# Patient Record
Sex: Female | Born: 1975 | Race: White | Hispanic: No | Marital: Single | Smoking: Never smoker
Health system: Southern US, Community
[De-identification: ages and names within clinical notes are randomized; demographics above are authoritative.]

## PROBLEM LIST (undated history)

## (undated) DIAGNOSIS — F329 Major depressive disorder, single episode, unspecified: Secondary | ICD-10-CM

## (undated) DIAGNOSIS — F32A Depression, unspecified: Secondary | ICD-10-CM

## (undated) DIAGNOSIS — I10 Essential (primary) hypertension: Secondary | ICD-10-CM

## (undated) DIAGNOSIS — E559 Vitamin D deficiency, unspecified: Secondary | ICD-10-CM

## (undated) DIAGNOSIS — E785 Hyperlipidemia, unspecified: Secondary | ICD-10-CM

## (undated) HISTORY — DX: Essential (primary) hypertension: I10

## (undated) HISTORY — DX: Vitamin D deficiency, unspecified: E55.9

## (undated) HISTORY — DX: Depression, unspecified: F32.A

## (undated) HISTORY — PX: REDUCTION MAMMAPLASTY: SUR839

## (undated) HISTORY — DX: Hyperlipidemia, unspecified: E78.5

---
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
## 2006-11-02 HISTORY — PX: BACK SURGERY: SHX140

## 2007-01-06 ENCOUNTER — Ambulatory Visit (HOSPITAL_COMMUNITY): Admission: RE | Admit: 2007-01-06 | Discharge: 2007-01-06 | Payer: Self-pay | Admitting: Neurological Surgery

## 2007-07-25 IMAGING — CR DG LUMBAR SPINE 2-3V
1 series · 1 of 1 positions shown · non-contrast
Comparison: none

CLINICAL DATA: 30-year-old, disc herniation. 
 LUMBAR SPINE ? 2 VIEW:

[view not recorded]
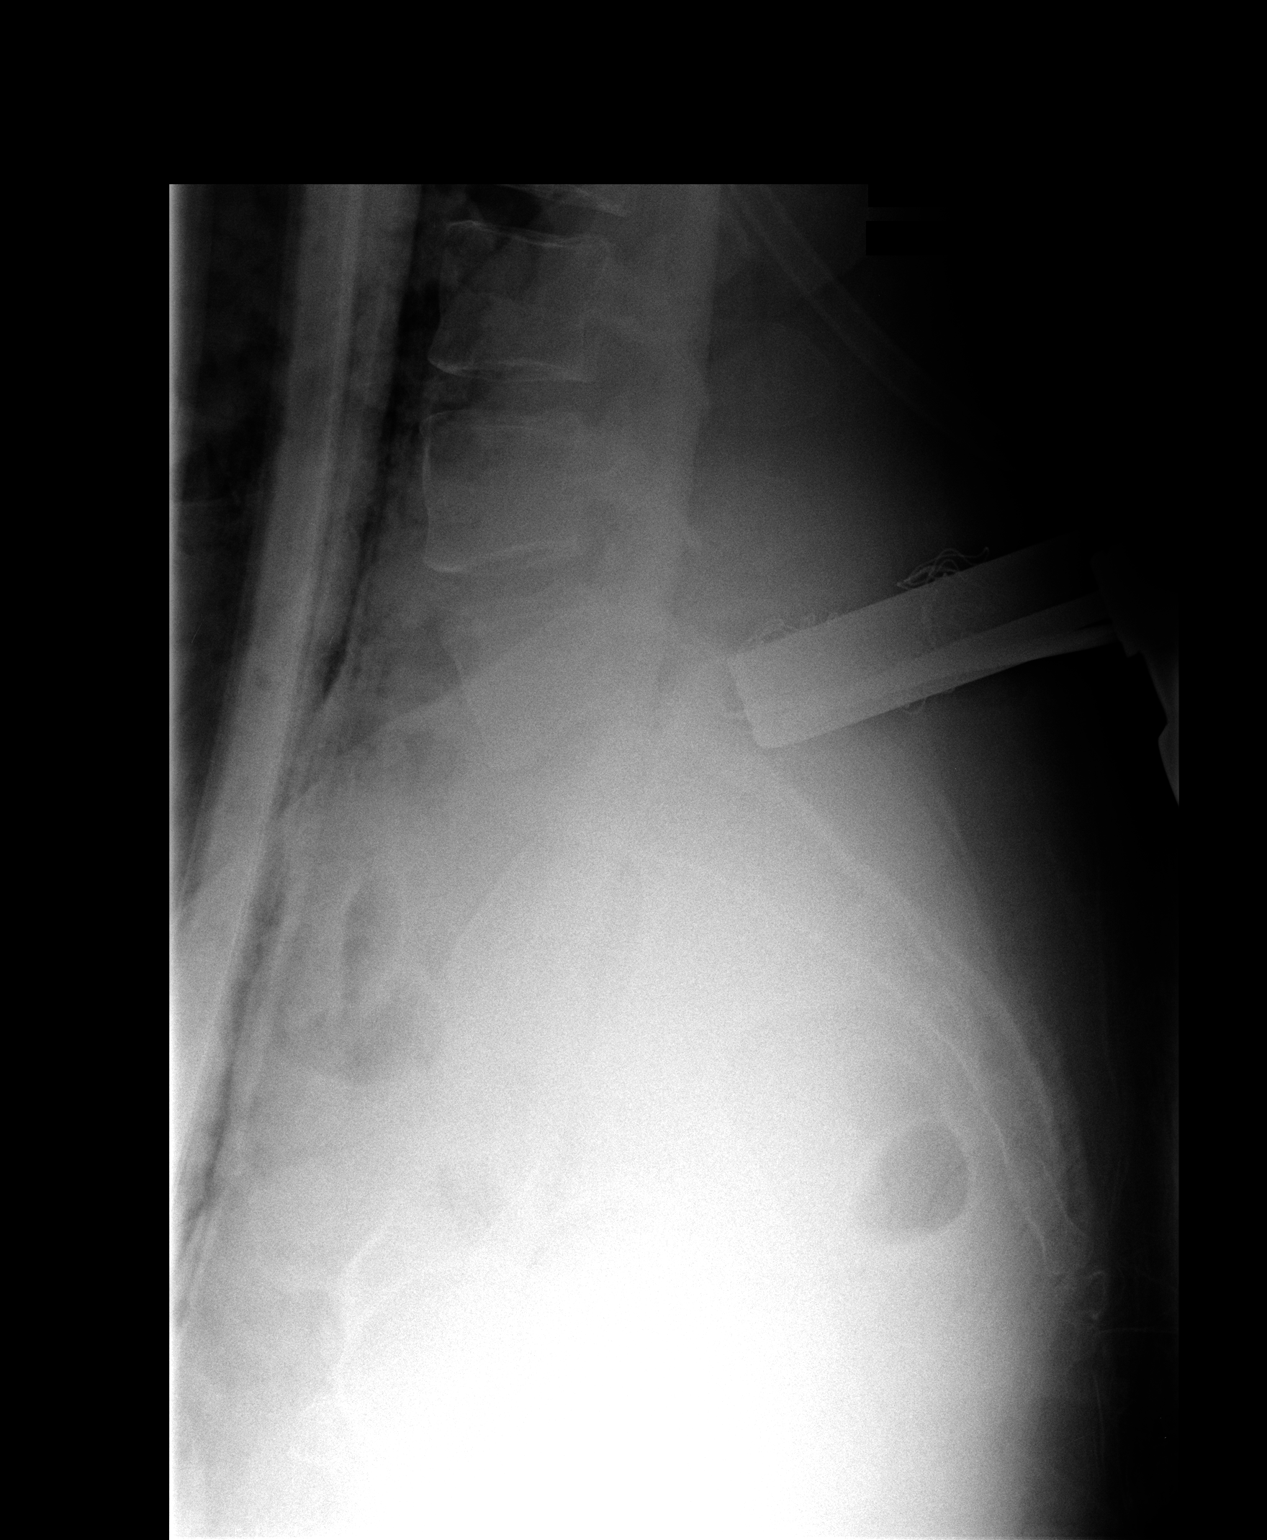

[1 of 1 positions shown; findings below may reference images not displayed]

FINDINGS: Two lateral lumbar spine films from the operating room.  The first film demonstrate a spinal needle projecting at the L5-S1 disc space.  The second film demonstrates surgical retractors and instrument marking the L5-S1 disc space.
IMPRESSION: L5-S1 marked intraoperatively.

## 2019-07-26 ENCOUNTER — Other Ambulatory Visit: Payer: Self-pay | Admitting: Sports Medicine

## 2019-07-26 DIAGNOSIS — M25572 Pain in left ankle and joints of left foot: Secondary | ICD-10-CM

## 2019-07-27 ENCOUNTER — Encounter: Payer: Self-pay | Admitting: Sports Medicine

## 2019-07-27 ENCOUNTER — Ambulatory Visit (INDEPENDENT_AMBULATORY_CARE_PROVIDER_SITE_OTHER): Payer: PRIVATE HEALTH INSURANCE

## 2019-07-27 ENCOUNTER — Ambulatory Visit: Payer: PRIVATE HEALTH INSURANCE | Admitting: Sports Medicine

## 2019-07-27 ENCOUNTER — Other Ambulatory Visit: Payer: Self-pay

## 2019-07-27 ENCOUNTER — Other Ambulatory Visit: Payer: Self-pay | Admitting: Sports Medicine

## 2019-07-27 DIAGNOSIS — M779 Enthesopathy, unspecified: Secondary | ICD-10-CM | POA: Diagnosis not present

## 2019-07-27 DIAGNOSIS — M25372 Other instability, left ankle: Secondary | ICD-10-CM | POA: Diagnosis not present

## 2019-07-27 DIAGNOSIS — S93402A Sprain of unspecified ligament of left ankle, initial encounter: Secondary | ICD-10-CM | POA: Diagnosis not present

## 2019-07-27 DIAGNOSIS — M79672 Pain in left foot: Secondary | ICD-10-CM | POA: Diagnosis not present

## 2019-07-27 DIAGNOSIS — M25572 Pain in left ankle and joints of left foot: Secondary | ICD-10-CM | POA: Diagnosis not present

## 2019-07-27 MED ORDER — PREDNISONE 10 MG (21) PO TBPK
ORAL_TABLET | ORAL | 0 refills | Status: DC
Start: 2019-07-27 — End: 2020-01-19

## 2019-07-27 NOTE — Progress Notes (Signed)
Subjective:  Allison Baldwin is a 43 y.o. female patient who presents to office for evaluation of left ankle pain. Patient complains of continued pain in the ankle since last week that gets worse by end of day and is limping with sharp pain especially with steps with foot pointing down with popping. Patient has tried nothing with no relief in symptoms. Patient denies any other pedal complaints. Denies injury/trip/fall/sprain/any causative factors.   Review of Systems  All other systems reviewed and are negative.   There are no active problems to display for this patient.   No current outpatient medications on file prior to visit.   No current facility-administered medications on file prior to visit.     Not on File  Objective:  General: Alert and oriented x3 in no acute distress  Dermatology: No open lesions bilateral lower extremities, no webspace macerations, no ecchymosis bilateral, all nails x 10 are well manicured.  Vascular: Dorsalis Pedis and Posterior Tibial pedal pulses palpable, Capillary Fill Time 3 seconds,(+) pedal hair growth bilateral, no edema bilateral lower extremities, Temperature gradient within normal limits.  Neurology: Johney Maine sensation intact via light touch bilateral.  Musculoskeletal: Mild tenderness with palpation at lateral ankle ligaments and  peroneal tendon course on left ankle. Negative talar tilt, Negative tib-fib stress, Mild anterior drawer with instability on left. No pain with calf compression bilateral. Range of motion within normal limits with mild guarding on left ankle. Strength within normal limits in all groups bilateral.   Gait: Antalgic gait  Xrays  Left Ankle   Impression: No acute osseous findings  Assessment and Plan: Problem List Items Addressed This Visit    None    Visit Diagnoses    Acute left ankle pain    -  Primary   Tendonitis       Sprain of left ankle, unspecified ligament, initial encounter       Ankle instability,  left          -Complete examination performed -Xrays reviewed -Discussed treatement options -Rx Prednisone dose pak -Rx Trilock -Recommend rest ice elevation and topical pain creams as needed -Patient to return to office in 2 weeks or sooner if condition worsens.  Advised patient if she fails to improve may benefit from an MRI for further evaluation of ankle tendons and ligaments.  Landis Martins, DPM

## 2019-08-02 ENCOUNTER — Other Ambulatory Visit: Payer: Self-pay | Admitting: Sports Medicine

## 2019-08-02 DIAGNOSIS — M25572 Pain in left ankle and joints of left foot: Secondary | ICD-10-CM

## 2019-08-02 DIAGNOSIS — S93402A Sprain of unspecified ligament of left ankle, initial encounter: Secondary | ICD-10-CM

## 2019-08-02 DIAGNOSIS — M79672 Pain in left foot: Secondary | ICD-10-CM

## 2019-08-02 DIAGNOSIS — M779 Enthesopathy, unspecified: Secondary | ICD-10-CM

## 2019-08-16 ENCOUNTER — Other Ambulatory Visit: Payer: Self-pay

## 2019-08-16 ENCOUNTER — Ambulatory Visit: Payer: PRIVATE HEALTH INSURANCE | Admitting: Sports Medicine

## 2019-08-16 ENCOUNTER — Encounter: Payer: Self-pay | Admitting: Sports Medicine

## 2019-08-16 DIAGNOSIS — S93402D Sprain of unspecified ligament of left ankle, subsequent encounter: Secondary | ICD-10-CM

## 2019-08-16 DIAGNOSIS — M25372 Other instability, left ankle: Secondary | ICD-10-CM | POA: Diagnosis not present

## 2019-08-16 DIAGNOSIS — M25572 Pain in left ankle and joints of left foot: Secondary | ICD-10-CM | POA: Diagnosis not present

## 2019-08-16 DIAGNOSIS — S93602D Unspecified sprain of left foot, subsequent encounter: Secondary | ICD-10-CM | POA: Diagnosis not present

## 2019-08-16 DIAGNOSIS — M779 Enthesopathy, unspecified: Secondary | ICD-10-CM

## 2019-08-16 NOTE — Progress Notes (Signed)
Subjective:  Allison Baldwin is a 43 y.o. female patient who presents to office for evaluation of left foot and ankle pain.  Patient reports that the brace that she has been using still aggravates her foot and ankle and it hurts with every step pain is sharp 5 out of 10 when weight or pressure is applied especially when attempting to toe walk reports that she has been elevating resting without much improvement.  Patient denies any increase in redness warmth swelling to the left lateral foot and ankle.  Patient does feel like her ankle is weak but denies any other symptoms at this time.  There are no active problems to display for this patient.   Current Outpatient Medications on File Prior to Visit  Medication Sig Dispense Refill  . citalopram (CELEXA) 20 MG tablet     . lisinopril-hydrochlorothiazide (ZESTORETIC) 10-12.5 MG tablet     . predniSONE (STERAPRED UNI-PAK 21 TAB) 10 MG (21) TBPK tablet Take as directed 21 tablet 0   No current facility-administered medications on file prior to visit.     Not on File  Objective:  General: Alert and oriented x3 in no acute distress  Dermatology: No open lesions bilateral lower extremities, no webspace macerations, no ecchymosis bilateral, all nails x 10 are well manicured.  Vascular: Dorsalis Pedis and Posterior Tibial pedal pulses palpable, Capillary Fill Time 3 seconds,(+) pedal hair growth bilateral, no edema bilateral lower extremities, Temperature gradient within normal limits.  Neurology: Johney Maine sensation intact via light touch bilateral.  Musculoskeletal: Mild tenderness with palpation at lateral ankle ligaments and  peroneal tendon course on left ankle with most pain today at the dorsal foot along the fourth and fifth metatarsal bases on the left sharp with weightbearing. Negative talar tilt, Negative tib-fib stress, Mild anterior drawer with instability on left. No pain with calf compression bilateral. Range of motion within normal  limits with mild guarding on left ankle. Strength within normal limits in all groups bilateral.   Assessment and Plan: Problem List Items Addressed This Visit    None    Visit Diagnoses    Acute left ankle pain    -  Primary   Ankle instability, left       Sprain of left ankle, unspecified ligament, subsequent encounter       Sprain of left foot, subsequent encounter       Tendonitis         -Complete examination performed -Previous xrays reviewed -Re-discussed treatment options -Dispensed cam boot; gave patient a used boot -Rx MRI for further evaluation since patient is actually getting worse instead of better -Recommend rest ice elevation and topical pain creams as needed -Patient to return to office in 2 weeks or sooner if condition worsens.  Advised patient if she fails to improve may benefit from an MRI for further evaluation of ankle tendons and ligaments.  Landis Martins, DPM

## 2019-08-25 ENCOUNTER — Telehealth: Payer: Self-pay | Admitting: *Deleted

## 2019-08-25 NOTE — Telephone Encounter (Signed)
Patient came in to office to drop off used boot she was given stating she can not wear it.  Patient states that she is wanting to have the MRI.

## 2019-08-25 NOTE — Telephone Encounter (Signed)
Thanks I have sent val a message to order MRI

## 2019-08-28 ENCOUNTER — Telehealth: Payer: Self-pay | Admitting: *Deleted

## 2019-08-28 DIAGNOSIS — M25372 Other instability, left ankle: Secondary | ICD-10-CM

## 2019-08-28 DIAGNOSIS — S93602D Unspecified sprain of left foot, subsequent encounter: Secondary | ICD-10-CM

## 2019-08-28 DIAGNOSIS — M25572 Pain in left ankle and joints of left foot: Secondary | ICD-10-CM

## 2019-08-28 DIAGNOSIS — S93402D Sprain of unspecified ligament of left ankle, subsequent encounter: Secondary | ICD-10-CM

## 2019-08-28 DIAGNOSIS — M779 Enthesopathy, unspecified: Secondary | ICD-10-CM

## 2019-08-28 NOTE — Telephone Encounter (Signed)
Orders to J. Quintana, RN for pre-cert. 

## 2019-08-28 NOTE — Telephone Encounter (Signed)
-----   Message from Landis Martins, [place] sent at 08/25/2019 12:51 PM EDT ----- Regarding: MRI L ankle Continued pain in left foot and ankle with most pain located at the ankle patient has tried to wear cam boot ankle remains painful and patient is requesting for [place] to order a MRI to evaluate her ankle ligaments as I previously discussed with her at last office visit  Please order MRI left ankle without contrast rule out ligament tear

## 2019-09-15 NOTE — Telephone Encounter (Signed)
[facility] Jacob Moores scheduled pt for MRI left ankle 09/20/2019 arrive 2:45pm for 3:00pm imaging. Faxed orders to [facility].

## 2019-09-15 NOTE — Telephone Encounter (Signed)
[facility] AUTHORIZED MRI [id number] LEFT ANKLE 09/01/2019 TO 11/30/2019, REFERENCE: Martyn Malay ID: [id number].

## 2019-09-15 NOTE — Telephone Encounter (Signed)
I informed pt of [facility] appt and pt states she didn't know if she needed the MRI she still had the problem but was not the same. I told pt that it would have the be pre-certed again if she did not have at this time and if problem worsened she would have to wait.

## 2019-09-21 ENCOUNTER — Telehealth: Payer: Self-pay | Admitting: *Deleted

## 2019-09-21 NOTE — Telephone Encounter (Signed)
[facility] states they have a call report for pt.

## 2019-09-21 NOTE — Telephone Encounter (Signed)
Faxed results to Dr. Cannon Kettle in [place].

## 2019-09-21 NOTE — Telephone Encounter (Signed)
[facility] states ankle MRI shows a non-displaced cuboid fracture and will fax to [phone].

## 2019-09-22 ENCOUNTER — Telehealth: Payer: Self-pay

## 2019-09-22 ENCOUNTER — Encounter: Payer: Self-pay | Admitting: Sports Medicine

## 2019-09-22 NOTE — Telephone Encounter (Signed)
Called and spoke with Pt about her MRI results. Pt stated understanding of the MRI results. I instructed the to the Pt the Dr's orders and instructions Pt stated understanding.

## 2020-01-19 ENCOUNTER — Other Ambulatory Visit: Payer: Self-pay

## 2020-01-19 ENCOUNTER — Ambulatory Visit: Payer: PRIVATE HEALTH INSURANCE | Admitting: Physician Assistant

## 2020-01-19 ENCOUNTER — Encounter: Payer: Self-pay | Admitting: Physician Assistant

## 2020-01-19 VITALS — BP 128/84 | HR 76 | Temp 96.8°F | Resp 16 | Ht 64.0 in | Wt 253.2 lb

## 2020-01-19 DIAGNOSIS — F419 Anxiety disorder, unspecified: Secondary | ICD-10-CM

## 2020-01-19 DIAGNOSIS — I10 Essential (primary) hypertension: Secondary | ICD-10-CM | POA: Diagnosis not present

## 2020-01-19 MED ORDER — PHENTERMINE HCL 37.5 MG PO TABS: 37.5000 mg | ORAL_TABLET | Freq: Every day | ORAL | 0 refills | Status: DC

## 2020-01-19 NOTE — Assessment & Plan Note (Signed)
Well controlled. No changes to medicines.  Continue to work on eating a healthy diet and exercise.    

## 2020-01-19 NOTE — Assessment & Plan Note (Signed)
Will take adipex one more month and then take break Follow up in 3 months

## 2020-01-19 NOTE — Assessment & Plan Note (Signed)
Continue citalopram qd

## 2020-01-19 NOTE — Progress Notes (Signed)
Acute Office Visit  Subjective:    Patient ID: Allison Baldwin, female    DOB: 1976/06/07, 44 y.o.   MRN: [id number]  Chief Complaint  Patient presents with  . Hypertension  . Obesity    HPI Patient is in today for hypertension  Pt presents for follow up of hypertension.The patient is tolerating the medication well without side effects. Compliance with treatment has been good; including taking medication as directed , maintains a healthy diet and regular exercise regimen , and following up as directed. Patient was evaluated using exam, physical, labs and other information to perform evidence based decision making. She is currently taking lisinopril / hctz 10/12.5mg  qd  Pt with history of obesity - she has lost 2 pounds since last visit - she has been out of adipex for a few weeks - would like to take another month and then is scheduled for breast reduction surgery  Pt with history of anxiety - she is doing well on citalopram 20mg  qd - voices no concerns or problems   Past Medical History:  Diagnosis Date  . Depression   . Hyperlipidemia   . Hypertension   . Vitamin D deficiency     Past Surgical History:  Procedure Laterality Date  . BACK SURGERY  2008    Family History  Problem Relation Age of Onset  . Hypothyroidism Mother   . Congestive Heart Failure Father   . Breast cancer Paternal Grandmother     Social History   Socioeconomic History  . Marital status: Single    Spouse name: Not on file  . Number of children: Not on file  . Years of education: Not on file  . Highest education level: Not on file  Occupational History  . Not on file  Tobacco Use  . Smoking status: Never Smoker  . Smokeless tobacco: Never Used  Substance and Sexual Activity  . Alcohol use: Never  . Drug use: Not on file  . Sexual activity: Not on file  Other Topics Concern  . Not on file  Social History Narrative  . Not on file   Social Determinants of Health   Financial  Resource Strain:   . Difficulty of Paying Living Expenses:   Food Insecurity:   . Worried About Charity fundraiser in the Last Year:   . Arboriculturist in the Last Year:   Transportation Needs:   . Film/video editor (Medical):   Marland Kitchen Lack of Transportation (Non-Medical):   Physical Activity:   . Days of Exercise per Week:   . Minutes of Exercise per Session:   Stress:   . Feeling of Stress :   Social Connections:   . Frequency of Communication with Friends and Family:   . Frequency of Social Gatherings with Friends and Family:   . Attends Religious Services:   . Active Member of Clubs or Organizations:   . Attends Archivist Meetings:   Marland Kitchen Marital Status:   Intimate Partner Violence:   . Fear of Current or Ex-Partner:   . Emotionally Abused:   Marland Kitchen Physically Abused:   . Sexually Abused:      Current Outpatient Medications:  .  citalopram (CELEXA) 20 MG tablet, , Disp: , Rfl:  .  lisinopril-hydrochlorothiazide (ZESTORETIC) 10-12.5 MG tablet, , Disp: , Rfl:  .  phentermine (ADIPEX-P) 37.5 MG tablet, Take 1 tablet (37.5 mg total) by mouth daily before breakfast., Disp: 30 tablet, Rfl: 0   No Known Allergies  CONSTITUTIONAL: Negative for chills, fatigue, fever, unintentional weight gain and unintentional weight loss.  E/N/T: Negative for ear pain, nasal congestion and sore throat.  CARDIOVASCULAR: Negative for chest pain, dizziness, palpitations and pedal edema.  RESPIRATORY: Negative for recent cough and dyspnea.  GASTROINTESTINAL: Negative for abdominal pain, acid reflux symptoms, constipation, diarrhea, nausea and vomiting.  MSK: Negative for arthralgias and myalgias.  INTEGUMENTARY: Negative for rash.  NEUROLOGICAL: Negative for dizziness and headaches.  PSYCHIATRIC: Negative for sleep disturbance and to question depression screen.  Negative for depression, negative for anhedonia.         Objective:    PHYSICAL EXAM:   VS: BP 128/84   Pulse 76   Temp  (!) 96.8 F (36 C)   Resp 16   Ht 5\' 4"  (1.626 m)   Wt 253 lb 3.2 oz (114.9 kg)   BMI 43.46 kg/m   GEN: Well nourished, well developed, in no acute distress   Cardiac: RRR; no murmurs, rubs, or gallops,no edema - no significant varicosities Respiratory:  normal respiratory rate and pattern with no distress - normal breath sounds with no rales, rhonchi, wheezes or rubs  Neuro:  Alert and Oriented x 3, Strength and sensation are intact - CN II-Xii grossly intact Psych: euthymic mood, appropriate affect and demeanor   Wt Readings from Last 3 Encounters:  01/19/20 253 lb 3.2 oz (114.9 kg)    Health Maintenance Due  Topic Date Due  . HIV Screening  Never done  . TETANUS/TDAP  Never done  . PAP SMEAR-Modifier  Never done  . INFLUENZA VACCINE  Never done    There are no preventive care reminders to display for this patient.        Assessment & Plan:   Problem List Items Addressed This Visit      Cardiovascular and Mediastinum   Benign hypertension - Primary    Well controlled.  No changes to medicines.  Continue to work on eating a healthy diet and exercise.           Other   Morbid obesity (HCC)    Will take adipex one more month and then take break Follow up in 3 months      Relevant Medications   phentermine (ADIPEX-P) 37.5 MG tablet   Anxiety    Continue citalopram qd          Meds ordered this encounter  Medications  . phentermine (ADIPEX-P) 37.5 MG tablet    Sig: Take 1 tablet (37.5 mg total) by mouth daily before breakfast.    Dispense:  30 tablet    Refill:  0    Order Specific Question:   Supervising Provider    Answer:   01/21/20 Blane Ohara     SARA R Cyd Hostler, PA-C

## 2020-02-07 ENCOUNTER — Other Ambulatory Visit: Payer: Self-pay | Admitting: Physician Assistant

## 2020-02-07 MED ORDER — CITALOPRAM HYDROBROMIDE 20 MG PO TABS: 20.0000 mg | ORAL_TABLET | Freq: Every day | ORAL | 0 refills | Status: DC

## 2020-02-07 MED ORDER — LISINOPRIL-HYDROCHLOROTHIAZIDE 10-12.5 MG PO TABS: 1.0000 | ORAL_TABLET | Freq: Every day | ORAL | 0 refills | Status: DC

## 2020-02-12 ENCOUNTER — Encounter (HOSPITAL_BASED_OUTPATIENT_CLINIC_OR_DEPARTMENT_OTHER): Payer: Self-pay | Admitting: Plastic Surgery

## 2020-02-12 ENCOUNTER — Other Ambulatory Visit: Payer: Self-pay

## 2020-02-14 ENCOUNTER — Encounter (HOSPITAL_BASED_OUTPATIENT_CLINIC_OR_DEPARTMENT_OTHER)
Admission: RE | Admit: 2020-02-14 | Discharge: 2020-02-14 | Disposition: A | Discharge status: Home / Self Care | Payer: PRIVATE HEALTH INSURANCE | Source: Ambulatory Visit | Attending: Plastic Surgery | Admitting: Plastic Surgery

## 2020-02-14 DIAGNOSIS — Z01818 Encounter for other preprocedural examination: Secondary | ICD-10-CM | POA: Insufficient documentation

## 2020-02-14 LAB — BASIC METABOLIC PANEL
Anion gap: 9 (ref 5–15)
BUN: 8 mg/dL (ref 6–20)
CO2: 24 mmol/L (ref 22–32)
Calcium: 9 mg/dL (ref 8.9–10.3)
Chloride: 103 mmol/L (ref 98–111)
Creatinine, Ser: 0.71 mg/dL (ref 0.44–1.00)
GFR calc Af Amer: >60 mL/min (ref >60)
GFR calc non Af Amer: >60 mL/min (ref >60)
Glucose, Bld: 86 mg/dL (ref 70–99)
Potassium: 4.7 mmol/L (ref 3.5–5.1)
Sodium: 136 mmol/L (ref 135–145)

## 2020-02-14 NOTE — Progress Notes (Signed)

## 2020-02-14 NOTE — H&P (Signed)
Subjective:    Patient ID: Allison Baldwin is a 44 y.o. female.  HPI  Here for follow up discussion prior to planned breast reduction. Current 40 C/ 38 D. Reports recurrent rash/ boils beneath breasts that occur weekly. Attempts to control this with powder and antibiotic ointment. Reports shoulder and neck pain, worse at end work day. This has not improved with OTC pain medication, hot/cold packs, PT, specialty fitted bras. Denies numbness hands.  Wt- has fluctuated over least year, not sure how much, goal 200 lb for weight.  MMG- 06/2019 at [facility]. Denies family history breast or ovarian ca.  Works for [employer] in [employer]. Lives with mother.  Review of Systems     Objective:  Physical Exam  Cardiovascular: Normal rate, regular rhythm and normal heart sounds.  Pulmonary/Chest: Effort normal and breath sounds normal.  Lymphadenopathy:    She has no axillary adenopathy.  Skin:  Fitzpatrick 1    Grade 3 ptosis bilateral no masses striae over breast and lateral chest wall +shoulder grooving Left slightly larger volume >right  SN to nipple R 34 L 35 cm BW R 18 L 19 cm Nipple to IMF R 9 L 9 cm    Assessment:    Macromastia Chronic neck and back pain Intertrigo    Plan:    Plan bilateral breast reduction. Reviewed anchor type scars, OP surgery, drains, post operative visits and limitations, recovery. Diminished sensation nipple and breast skin, risk of nipple loss, wound healing problems, asymmetry, incidental carcinoma, changes with wt gain/loss, aging, unacceptable cosmetic appearance reviewed.   Additional risks including but not limited to bleeding, hematoma, seroma, damage to adjacent structures, need for additional surgery, fat necrosis that presents as lumps or drainage, blood clots in legs or lungs reviewed.  Patient has been fully vaccinated for COVID. Discussed risk COVID infectionthrough this elective surgery. Patient will receive COVID  testing prior to surgery. Discussed even if patient receivesa negative test result, the tests in some cases may fail to detect the virus or patient maycontract COVID after the test.COVID 19 infectionbefore/during/aftersurgery may result in lead to a higher chance of complication and death.  Rx for Norco given.  Anticipate 400 g reduction from each breast.

## 2020-02-14 NOTE — Progress Notes (Signed)
Unable to obtain POCT, will do day of surgery.

## 2020-02-16 ENCOUNTER — Other Ambulatory Visit (HOSPITAL_COMMUNITY)
Admission: RE | Admit: 2020-02-16 | Discharge: 2020-02-16 | Disposition: A | Discharge status: Home / Self Care | Payer: PRIVATE HEALTH INSURANCE | Source: Ambulatory Visit | Attending: Plastic Surgery | Admitting: Plastic Surgery

## 2020-02-16 DIAGNOSIS — Z20822 Contact with and (suspected) exposure to covid-19: Secondary | ICD-10-CM | POA: Insufficient documentation

## 2020-02-16 DIAGNOSIS — Z01812 Encounter for preprocedural laboratory examination: Secondary | ICD-10-CM | POA: Insufficient documentation

## 2020-02-16 LAB — SARS CORONAVIRUS 2 (TAT 6-24 HRS): SARS Coronavirus 2: NEGATIVE

## 2020-02-19 NOTE — Anesthesia Preprocedure Evaluation (Addendum)
Anesthesia Evaluation  Patient identified by MRN, date of birth, ID band Patient awake    Reviewed: Allergy & Precautions, NPO status , Patient's Chart, lab work & pertinent test results  Airway Mallampati: III  TM Distance: >3 FB Neck ROM: Full    Dental no notable dental hx. (+) Teeth Intact, Dental Advisory Given   Pulmonary  Snoring, but no witnessed apneas. Never had sleep study    Pulmonary exam normal breath sounds clear to auscultation       Cardiovascular hypertension, Pt. on medications Normal cardiovascular exam Rhythm:Regular Rate:Normal  HLD   Neuro/Psych PSYCHIATRIC DISORDERS Anxiety Depression negative neurological ROS     GI/Hepatic negative GI ROS, Neg liver ROS,   Endo/Other  Morbid obesityBMI 44  Renal/GU negative Renal ROS  negative genitourinary   Musculoskeletal negative musculoskeletal ROS (+)   Abdominal (+) + obese,   Peds negative pediatric ROS (+)  Hematology negative hematology ROS (+)   Anesthesia Other Findings Macromastia, intertrigo, chronic neck and back pain  Reproductive/Obstetrics negative OB ROS                            Anesthesia Physical Anesthesia Plan  ASA: III  Anesthesia Plan: General   Post-op Pain Management:    Induction: Intravenous  PONV Risk Score and Plan: 3 and Dexamethasone, Ondansetron, Midazolam, Scopolamine patch - Pre-op and Treatment may vary due to age or medical condition  Airway Management Planned: Oral ETT  Additional Equipment: None  Intra-op Plan:   Post-operative Plan: Extubation in OR  Informed Consent: I have reviewed the patients History and Physical, chart, labs and discussed the procedure including the risks, benefits and alternatives for the proposed anesthesia with the patient or authorized representative who has indicated his/her understanding and acceptance.     Dental advisory given  Plan  Discussed with: CRNA  Anesthesia Plan Comments:         Anesthesia Quick Evaluation

## 2020-02-20 ENCOUNTER — Other Ambulatory Visit: Payer: Self-pay

## 2020-02-20 ENCOUNTER — Ambulatory Visit (HOSPITAL_BASED_OUTPATIENT_CLINIC_OR_DEPARTMENT_OTHER)
Admission: RE | Admit: 2020-02-20 | Discharge: 2020-02-20 | Disposition: A | Discharge status: Home / Self Care | Payer: PRIVATE HEALTH INSURANCE | Attending: Plastic Surgery | Admitting: Plastic Surgery

## 2020-02-20 ENCOUNTER — Ambulatory Visit (HOSPITAL_BASED_OUTPATIENT_CLINIC_OR_DEPARTMENT_OTHER): Payer: PRIVATE HEALTH INSURANCE | Admitting: Anesthesiology

## 2020-02-20 ENCOUNTER — Encounter (HOSPITAL_BASED_OUTPATIENT_CLINIC_OR_DEPARTMENT_OTHER): Payer: Self-pay | Admitting: Plastic Surgery

## 2020-02-20 ENCOUNTER — Encounter (HOSPITAL_BASED_OUTPATIENT_CLINIC_OR_DEPARTMENT_OTHER)
Admission: RE | Disposition: A | Discharge status: Home / Self Care | Payer: Self-pay | Source: Home / Self Care | Attending: Plastic Surgery

## 2020-02-20 DIAGNOSIS — Z20822 Contact with and (suspected) exposure to covid-19: Secondary | ICD-10-CM | POA: Diagnosis not present

## 2020-02-20 DIAGNOSIS — E669 Obesity, unspecified: Secondary | ICD-10-CM | POA: Diagnosis not present

## 2020-02-20 DIAGNOSIS — R0683 Snoring: Secondary | ICD-10-CM | POA: Diagnosis not present

## 2020-02-20 DIAGNOSIS — N62 Hypertrophy of breast: Secondary | ICD-10-CM | POA: Insufficient documentation

## 2020-02-20 DIAGNOSIS — I10 Essential (primary) hypertension: Secondary | ICD-10-CM | POA: Diagnosis not present

## 2020-02-20 DIAGNOSIS — E785 Hyperlipidemia, unspecified: Secondary | ICD-10-CM | POA: Diagnosis not present

## 2020-02-20 DIAGNOSIS — L304 Erythema intertrigo: Secondary | ICD-10-CM | POA: Diagnosis not present

## 2020-02-20 DIAGNOSIS — M549 Dorsalgia, unspecified: Secondary | ICD-10-CM | POA: Diagnosis not present

## 2020-02-20 DIAGNOSIS — M542 Cervicalgia: Secondary | ICD-10-CM | POA: Insufficient documentation

## 2020-02-20 DIAGNOSIS — G8929 Other chronic pain: Secondary | ICD-10-CM | POA: Insufficient documentation

## 2020-02-20 DIAGNOSIS — F419 Anxiety disorder, unspecified: Secondary | ICD-10-CM | POA: Diagnosis not present

## 2020-02-20 DIAGNOSIS — F329 Major depressive disorder, single episode, unspecified: Secondary | ICD-10-CM | POA: Diagnosis not present

## 2020-02-20 DIAGNOSIS — Z6841 Body Mass Index (BMI) 40.0 and over, adult: Secondary | ICD-10-CM | POA: Diagnosis not present

## 2020-02-20 HISTORY — PX: BREAST REDUCTION SURGERY: SHX8

## 2020-02-20 LAB — POCT PREGNANCY, URINE: Preg Test, Ur: NEGATIVE

## 2020-02-20 SURGERY — MAMMOPLASTY, REDUCTION
Anesthesia: General | Site: Breast | Laterality: Bilateral

## 2020-02-20 MED ORDER — CEFAZOLIN SODIUM-DEXTROSE 2-4 GM/100ML-% IV SOLN
2.0000 g | INTRAVENOUS | Status: AC
  Administered 2020-02-20: 2 g via INTRAVENOUS

## 2020-02-20 MED ORDER — HEPARIN SODIUM (PORCINE) 5000 UNIT/ML IJ SOLN: 5000.0000 [IU] | Freq: Once | INTRAMUSCULAR | Status: DC

## 2020-02-20 MED ORDER — HYDROMORPHONE HCL 1 MG/ML IJ SOLN
INTRAMUSCULAR | Status: AC
  Filled 2020-02-20: qty 0.5

## 2020-02-20 MED ORDER — PHENYLEPHRINE 40 MCG/ML (10ML) SYRINGE FOR IV PUSH (FOR BLOOD PRESSURE SUPPORT)
PREFILLED_SYRINGE | INTRAVENOUS | Status: AC
  Filled 2020-02-20: qty 10

## 2020-02-20 MED ORDER — SCOPOLAMINE 1 MG/3DAYS TD PT72
1.0000 | MEDICATED_PATCH | TRANSDERMAL | Status: DC
  Administered 2020-02-20: 1.5 mg via TRANSDERMAL

## 2020-02-20 MED ORDER — PROPOFOL 10 MG/ML IV BOLUS
INTRAVENOUS | Status: AC
  Filled 2020-02-20: qty 20

## 2020-02-20 MED ORDER — DEXAMETHASONE SODIUM PHOSPHATE 10 MG/ML IJ SOLN
INTRAMUSCULAR | Status: AC
  Filled 2020-02-20: qty 1

## 2020-02-20 MED ORDER — CELECOXIB 200 MG PO CAPS
200.0000 mg | ORAL_CAPSULE | ORAL | Status: AC
  Administered 2020-02-20: 200 mg via ORAL

## 2020-02-20 MED ORDER — SODIUM CHLORIDE 0.9 % IV SOLN
INTRAVENOUS | Status: DC | PRN
  Administered 2020-02-20: 40 mL

## 2020-02-20 MED ORDER — ONDANSETRON HCL 4 MG/2ML IJ SOLN
INTRAMUSCULAR | Status: DC | PRN
  Administered 2020-02-20: 4 mg via INTRAVENOUS

## 2020-02-20 MED ORDER — MEPERIDINE HCL 25 MG/ML IJ SOLN: 6.2500 mg | INTRAMUSCULAR | Status: DC | PRN

## 2020-02-20 MED ORDER — FENTANYL CITRATE (PF) 100 MCG/2ML IJ SOLN
INTRAMUSCULAR | Status: AC
  Filled 2020-02-20: qty 2

## 2020-02-20 MED ORDER — LIDOCAINE 2% (20 MG/ML) 5 ML SYRINGE
INTRAMUSCULAR | Status: DC | PRN
  Administered 2020-02-20: 60 mg via INTRAVENOUS

## 2020-02-20 MED ORDER — OXYCODONE HCL 5 MG PO TABS: 5.0000 mg | ORAL_TABLET | Freq: Once | ORAL | Status: DC | PRN

## 2020-02-20 MED ORDER — ACETAMINOPHEN 500 MG PO TABS
1000.0000 mg | ORAL_TABLET | Freq: Once | ORAL | Status: AC
  Administered 2020-02-20: 1000 mg via ORAL

## 2020-02-20 MED ORDER — SCOPOLAMINE 1 MG/3DAYS TD PT72
MEDICATED_PATCH | TRANSDERMAL | Status: AC
  Filled 2020-02-20: qty 1

## 2020-02-20 MED ORDER — CELECOXIB 200 MG PO CAPS
ORAL_CAPSULE | ORAL | Status: AC
  Filled 2020-02-20: qty 1

## 2020-02-20 MED ORDER — PHENYLEPHRINE 40 MCG/ML (10ML) SYRINGE FOR IV PUSH (FOR BLOOD PRESSURE SUPPORT)
PREFILLED_SYRINGE | INTRAVENOUS | Status: DC | PRN
  Administered 2020-02-20 (×5): 80 ug via INTRAVENOUS

## 2020-02-20 MED ORDER — ROCURONIUM BROMIDE 100 MG/10ML IV SOLN
INTRAVENOUS | Status: DC | PRN
  Administered 2020-02-20: 20 mg via INTRAVENOUS
  Administered 2020-02-20: 60 mg via INTRAVENOUS

## 2020-02-20 MED ORDER — 0.9 % SODIUM CHLORIDE (POUR BTL) OPTIME
TOPICAL | Status: DC | PRN
  Administered 2020-02-20: 1000 mL

## 2020-02-20 MED ORDER — ACETAMINOPHEN 500 MG PO TABS
ORAL_TABLET | ORAL | Status: AC
  Filled 2020-02-20: qty 2

## 2020-02-20 MED ORDER — CHLORHEXIDINE GLUCONATE CLOTH 2 % EX PADS: 6.0000 | MEDICATED_PAD | Freq: Once | CUTANEOUS | Status: DC

## 2020-02-20 MED ORDER — CEFAZOLIN SODIUM-DEXTROSE 2-4 GM/100ML-% IV SOLN
INTRAVENOUS | Status: AC
  Filled 2020-02-20: qty 100

## 2020-02-20 MED ORDER — HEPARIN SODIUM (PORCINE) 5000 UNIT/ML IJ SOLN
INTRAMUSCULAR | Status: AC
  Filled 2020-02-20: qty 1

## 2020-02-20 MED ORDER — ACETAMINOPHEN 500 MG PO TABS: 1000.0000 mg | ORAL_TABLET | ORAL | Status: DC

## 2020-02-20 MED ORDER — SUGAMMADEX SODIUM 200 MG/2ML IV SOLN
INTRAVENOUS | Status: DC | PRN
  Administered 2020-02-20: 200 mg via INTRAVENOUS

## 2020-02-20 MED ORDER — LACTATED RINGERS IV SOLN: INTRAVENOUS | Status: DC

## 2020-02-20 MED ORDER — BUPIVACAINE LIPOSOME 1.3 % IJ SUSP
INTRAMUSCULAR | Status: AC
  Filled 2020-02-20: qty 20

## 2020-02-20 MED ORDER — MIDAZOLAM HCL 5 MG/5ML IJ SOLN
INTRAMUSCULAR | Status: DC | PRN
  Administered 2020-02-20: 2 mg via INTRAVENOUS

## 2020-02-20 MED ORDER — PROPOFOL 10 MG/ML IV BOLUS
INTRAVENOUS | Status: DC | PRN
  Administered 2020-02-20: 200 mg via INTRAVENOUS

## 2020-02-20 MED ORDER — GABAPENTIN 300 MG PO CAPS
300.0000 mg | ORAL_CAPSULE | ORAL | Status: AC
  Administered 2020-02-20: 300 mg via ORAL

## 2020-02-20 MED ORDER — EPHEDRINE SULFATE-NACL 50-0.9 MG/10ML-% IV SOSY
PREFILLED_SYRINGE | INTRAVENOUS | Status: DC | PRN
  Administered 2020-02-20: 5 mg via INTRAVENOUS
  Administered 2020-02-20: 2.5 mg via INTRAVENOUS

## 2020-02-20 MED ORDER — KETOROLAC TROMETHAMINE 30 MG/ML IJ SOLN: 30.0000 mg | Freq: Once | INTRAMUSCULAR | Status: DC | PRN

## 2020-02-20 MED ORDER — DEXAMETHASONE SODIUM PHOSPHATE 4 MG/ML IJ SOLN
INTRAMUSCULAR | Status: DC | PRN
  Administered 2020-02-20: 10 mg via INTRAVENOUS

## 2020-02-20 MED ORDER — HYDROMORPHONE HCL 1 MG/ML IJ SOLN
0.2500 mg | INTRAMUSCULAR | Status: DC | PRN
  Administered 2020-02-20: 0.5 mg via INTRAVENOUS

## 2020-02-20 MED ORDER — FENTANYL CITRATE (PF) 100 MCG/2ML IJ SOLN
INTRAMUSCULAR | Status: DC | PRN
  Administered 2020-02-20 (×3): 50 ug via INTRAVENOUS

## 2020-02-20 MED ORDER — SODIUM CHLORIDE (PF) 0.9 % IJ SOLN
INTRAMUSCULAR | Status: AC
  Filled 2020-02-20: qty 20

## 2020-02-20 MED ORDER — ONDANSETRON HCL 4 MG/2ML IJ SOLN
INTRAMUSCULAR | Status: AC
  Filled 2020-02-20: qty 2

## 2020-02-20 MED ORDER — GABAPENTIN 300 MG PO CAPS
ORAL_CAPSULE | ORAL | Status: AC
  Filled 2020-02-20: qty 1

## 2020-02-20 MED ORDER — OXYCODONE HCL 5 MG/5ML PO SOLN: 5.0000 mg | Freq: Once | ORAL | Status: DC | PRN

## 2020-02-20 MED ORDER — MIDAZOLAM HCL 2 MG/2ML IJ SOLN
INTRAMUSCULAR | Status: AC
  Filled 2020-02-20: qty 2

## 2020-02-20 MED ORDER — PROMETHAZINE HCL 25 MG/ML IJ SOLN: 6.2500 mg | INTRAMUSCULAR | Status: DC | PRN

## 2020-02-20 MED ORDER — PROPOFOL 10 MG/ML IV BOLUS
INTRAVENOUS | Status: AC
  Filled 2020-02-20: qty 40

## 2020-02-20 SURGICAL SUPPLY — 66 items
ADH SKN CLS APL DERMABOND .7 (GAUZE/BANDAGES/DRESSINGS) ×2
APL PRP STRL LF DISP 70% ISPRP (MISCELLANEOUS) ×2
APPLIER CLIP 9.375 MED OPEN (MISCELLANEOUS)
APR CLP MED 9.3 20 MLT OPN (MISCELLANEOUS)
BINDER BREAST 3XL (GAUZE/BANDAGES/DRESSINGS) IMPLANT
BINDER BREAST LRG (GAUZE/BANDAGES/DRESSINGS) IMPLANT
BINDER BREAST MEDIUM (GAUZE/BANDAGES/DRESSINGS) IMPLANT
BINDER BREAST XLRG (GAUZE/BANDAGES/DRESSINGS) IMPLANT
BINDER BREAST XXLRG (GAUZE/BANDAGES/DRESSINGS) IMPLANT
BLADE SURG 10 STRL SS (BLADE) ×12 IMPLANT
BNDG GAUZE ELAST 4 BULKY (GAUZE/BANDAGES/DRESSINGS) ×6 IMPLANT
CANISTER SUCT 1200ML W/VALVE (MISCELLANEOUS) ×3 IMPLANT
CHLORAPREP W/TINT 26 (MISCELLANEOUS) ×6 IMPLANT
CLIP APPLIE 9.375 MED OPEN (MISCELLANEOUS) IMPLANT
CLIP VESOCCLUDE MED 6/CT (CLIP) IMPLANT
COVER BACK TABLE 60X90IN (DRAPES) ×3 IMPLANT
COVER MAYO STAND STRL (DRAPES) ×3 IMPLANT
COVER WAND RF STERILE (DRAPES) IMPLANT
DERMABOND ADVANCED (GAUZE/BANDAGES/DRESSINGS) ×4
DERMABOND ADVANCED .7 DNX12 (GAUZE/BANDAGES/DRESSINGS) ×1 IMPLANT
DRAIN CHANNEL 15F RND FF W/TCR (WOUND CARE) ×4 IMPLANT
DRAPE TOP ARMCOVERS (MISCELLANEOUS) ×3 IMPLANT
DRAPE U-SHAPE 76X120 STRL (DRAPES) ×3 IMPLANT
DRAPE UTILITY XL STRL (DRAPES) ×3 IMPLANT
DRSG PAD ABDOMINAL 8X10 ST (GAUZE/BANDAGES/DRESSINGS) ×6 IMPLANT
ELECT COATED BLADE 2.86 ST (ELECTRODE) ×3 IMPLANT
ELECT REM PT RETURN 9FT ADLT (ELECTROSURGICAL) ×3
ELECTRODE REM PT RTRN 9FT ADLT (ELECTROSURGICAL) ×1 IMPLANT
EVACUATOR SILICONE 100CC (DRAIN) ×4 IMPLANT
GLOVE BIO SURGEON STRL SZ 6 (GLOVE) ×10 IMPLANT
GLOVE BIO SURGEON STRL SZ 6.5 (GLOVE) ×1 IMPLANT
GLOVE BIO SURGEONS STRL SZ 6.5 (GLOVE) ×1
GLOVE BIOGEL PI IND STRL 6.5 (GLOVE) IMPLANT
GLOVE BIOGEL PI IND STRL 7.0 (GLOVE) IMPLANT
GLOVE BIOGEL PI INDICATOR 6.5 (GLOVE) ×6
GLOVE BIOGEL PI INDICATOR 7.0 (GLOVE) ×2
GLOVE ECLIPSE 6.5 STRL STRAW (GLOVE) ×8 IMPLANT
GLOVE ECLIPSE 7.0 STRL STRAW (GLOVE) ×2 IMPLANT
GOWN STRL REUS W/ TWL LRG LVL3 (GOWN DISPOSABLE) ×2 IMPLANT
GOWN STRL REUS W/TWL LRG LVL3 (GOWN DISPOSABLE) ×12
MARKER SKIN DUAL TIP RULER LAB (MISCELLANEOUS) IMPLANT
NDL HYPO 25X1 1.5 SAFETY (NEEDLE) IMPLANT
NEEDLE HYPO 25X1 1.5 SAFETY (NEEDLE) ×3 IMPLANT
NS IRRIG 1000ML POUR BTL (IV SOLUTION) ×3 IMPLANT
PACK BASIN DAY SURGERY FS (CUSTOM PROCEDURE TRAY) ×3 IMPLANT
PENCIL SMOKE EVACUATOR (MISCELLANEOUS) ×3 IMPLANT
PIN SAFETY STERILE (MISCELLANEOUS) ×3 IMPLANT
SHEET MEDIUM DRAPE 40X70 STRL (DRAPES) ×6 IMPLANT
SLEEVE SCD COMPRESS KNEE MED (MISCELLANEOUS) ×3 IMPLANT
SPONGE LAP 18X18 RF (DISPOSABLE) ×10 IMPLANT
STAPLER VISISTAT 35W (STAPLE) ×3 IMPLANT
SUT ETHILON 2 0 FS 18 (SUTURE) ×3 IMPLANT
SUT MNCRL AB 4-0 PS2 18 (SUTURE) ×12 IMPLANT
SUT PDS AB 2-0 CT2 27 (SUTURE) IMPLANT
SUT VIC AB 3-0 PS1 18 (SUTURE) ×18
SUT VIC AB 3-0 PS1 18XBRD (SUTURE) ×4 IMPLANT
SUT VICRYL 4-0 PS2 18IN ABS (SUTURE) ×6 IMPLANT
SYR BULB IRRIGATION 50ML (SYRINGE) ×3 IMPLANT
SYR CONTROL 10ML LL (SYRINGE) ×2 IMPLANT
TAPE MEASURE VINYL STERILE (MISCELLANEOUS) IMPLANT
TOWEL GREEN STERILE FF (TOWEL DISPOSABLE) ×4 IMPLANT
TRAY FOLEY W/BAG SLVR 14FR LF (SET/KITS/TRAYS/PACK) IMPLANT
TUBE CONNECTING 20'X1/4 (TUBING) ×1
TUBE CONNECTING 20X1/4 (TUBING) ×2 IMPLANT
UNDERPAD 30X36 HEAVY ABSORB (UNDERPADS AND DIAPERS) ×6 IMPLANT
YANKAUER SUCT BULB TIP NO VENT (SUCTIONS) ×3 IMPLANT

## 2020-02-20 NOTE — Anesthesia Procedure Notes (Signed)
Procedure Name: Intubation Date/Time: 02/20/2020 7:40 AM Performed by: Caren Macadam, CRNA Pre-anesthesia Checklist: Patient identified, Emergency Drugs available, Suction available and Patient being monitored Patient Re-evaluated:Patient Re-evaluated prior to induction Oxygen Delivery Method: Circle system utilized Preoxygenation: Pre-oxygenation with 100% oxygen Induction Type: IV induction Ventilation: Mask ventilation without difficulty Laryngoscope Size: Miller and 2 Grade View: Grade I Tube type: Oral Tube size: 7.0 mm Number of attempts: 1 Airway Equipment and Method: Stylet and Oral airway Placement Confirmation: ETT inserted through vocal cords under direct vision,  positive ETCO2 and breath sounds checked- equal and bilateral Secured at: 20 cm Tube secured with: Tape Dental Injury: Teeth and Oropharynx as per pre-operative assessment

## 2020-02-20 NOTE — Discharge Instructions (Signed)

## 2020-02-20 NOTE — Op Note (Signed)
Operative Note   DATE OF OPERATION: 4.20.21  LOCATION: [facility]-outpatient  SURGICAL DIVISION: Plastic Surgery  PREOPERATIVE DIAGNOSES:  1. Macromastia 2.Chronic neck and back pain 3. Intertrigo  POSTOPERATIVE DIAGNOSES:  same  PROCEDURE:  Bilateral breast reduction  SURGEON: Glenna Fellows MD MBA  ASSISTANT: none  ANESTHESIA:  General.   EBL: 25 ml  COMPLICATIONS: None immediate.   INDICATIONS FOR PROCEDURE:  The patient, Allison Baldwin, is a 44 y.o. female born on [birth year], is here for breast reduction in setting of chronic back pain, rashes not relived by conservative measures.   FINDINGS: Right reduction 270 g Left 314 g  DESCRIPTION OF PROCEDURE:  The patient was marked standing in the preoperative area to mark sternal notch, chest midline, anterior axillary lines, inframammary folds. The location of new nipple areolar complex was marked at level of on inframammary fold on anterior surface breast by palpation. This was marked symmetric over bilateral breasts. With aid of Wise pattern marker, location of new nipple areolar complex and vertical limbs (7cm) were markedby displacement of breasts along meridian.SQ heparin administered.The patient was taken to the operating room. SCDs were placedand IV antibiotics were given. The patient's operative site was prepped and draped in a sterile fashion. A time out was performed and all information was confirmed to be correct.   Over left breast, superomedial pedicle marked and nipple areolar complexincisedwith 38 mm diameter marker. Pedicle deepithlialized and developedto chest wall. Breast tissue resected over lower pole. Medial and lateral flaps developed.Additionalsuperior pole andlateral chest wall tissue excised.Breast tailor tacked closed.  I then directed attention to right breast where superomedial pedicle designed.NAC marked with 38 mm diameter marker.The pedicle was deepithelialized. Pedicle  developed until tension free rotation was possible.Breast tissue resected over lower pole. Medial and lateral flaps developed.Additionalsuperior pole andlateral chest wall tissue excised. Breast tailor tacked closed, and patient brought to upright sitting position and assessed for symmetry. Patent returned to supine position. Breast cavities irrigated and hemostasis obtained.Exparelinfiltrated throughout each breast. 15 Fr JP placed in each breast and secured with 2-0 nylon. Closure completed bilateralwith 3-0 vicryl to approximate dermis along inframammary fold and vertical limb. NAC inset with 4-0 vicryl in dermis. Skin closure completed with 4-0 monocryl subcuticular throughout. Tissue adhesive applied. Dry dressing and breast binder applied.  The patient was allowed to wake from anesthesia, extubated and taken to the recovery room in satisfactory condition.   SPECIMENS:  right and left breast reduction  DRAINS: 15 Fr JP in right and left breast reduction

## 2020-02-20 NOTE — Anesthesia Postprocedure Evaluation (Signed)
Anesthesia Post Note  Patient: Beatris D Emmanuel  Procedure(s) Performed: BILATERAL BREAST REDUCTION (Bilateral Breast)     Patient location during evaluation: PACU Anesthesia Type: General Level of consciousness: awake and alert, oriented and patient cooperative Pain management: pain level controlled Vital Signs Assessment: post-procedure vital signs reviewed and stable Respiratory status: spontaneous breathing, nonlabored ventilation and respiratory function stable Cardiovascular status: blood pressure returned to baseline and stable Postop Assessment: no apparent nausea or vomiting Anesthetic complications: no    Last Vitals:  Vitals:   02/20/20 0638 02/20/20 1050  BP: (!) 148/81 107/88  Pulse: 68 (!) 103  Resp: 20 (!) 21  Temp: (!) 35.9 C 36.7 C  SpO2: 100% 100%    Last Pain:  Vitals:   02/20/20 1050  TempSrc:   PainSc: 0-No pain                 Tennis Must Tacha Manni

## 2020-02-20 NOTE — Interval H&P Note (Signed)
History and Physical Interval Note:  02/20/2020 6:57 AM  Allison Baldwin  has presented today for surgery, with the diagnosis of macromastia, intertrigo, chronic neck and back pain.  The various methods of treatment have been discussed with the patient and family. After consideration of risks, benefits and other options for treatment, the patient has consented to  Procedure(s): BILATERAL BREAST REDUCTION (Bilateral) as a surgical intervention.  The patient's history has been reviewed, patient examined, no change in status, stable for surgery.  I have reviewed the patient's chart and labs.  Questions were answered to the patient's satisfaction.     [name]

## 2020-02-20 NOTE — Transfer of Care (Signed)
Immediate Anesthesia Transfer of Care Note  Patient: Allison Baldwin  Procedure(s) Performed: BILATERAL BREAST REDUCTION (Bilateral Breast)  Patient Location: PACU  Anesthesia Type:General  Level of Consciousness: awake  Airway & Oxygen Therapy: Patient Spontanous Breathing and Patient connected to face mask oxygen  Post-op Assessment: Report given to RN and Post -op Vital signs reviewed and stable  Post vital signs: Reviewed and stable  Last Vitals:  Vitals Value Taken Time  BP    Temp    Pulse 99 02/20/20 1051  Resp 21 02/20/20 1051  SpO2 100 % 02/20/20 1051  Vitals shown include unvalidated device data.  Last Pain:  Vitals:   02/20/20 0638  TempSrc: Tympanic  PainSc: 0-No pain      Patients Stated Pain Goal: 8 (02/20/20 3014)  Complications: No apparent anesthesia complications

## 2020-02-21 ENCOUNTER — Encounter: Payer: Self-pay | Admitting: *Deleted

## 2020-02-21 LAB — SURGICAL PATHOLOGY

## 2020-04-23 ENCOUNTER — Ambulatory Visit: Payer: PRIVATE HEALTH INSURANCE | Admitting: Physician Assistant

## 2020-06-04 ENCOUNTER — Ambulatory Visit: Payer: PRIVATE HEALTH INSURANCE | Admitting: Physician Assistant

## 2020-06-04 ENCOUNTER — Encounter: Payer: Self-pay | Admitting: Physician Assistant

## 2020-06-04 ENCOUNTER — Other Ambulatory Visit: Payer: Self-pay

## 2020-06-04 VITALS — BP 128/84 | HR 72 | Temp 97.8°F | Wt 251.8 lb

## 2020-06-04 DIAGNOSIS — I10 Essential (primary) hypertension: Secondary | ICD-10-CM

## 2020-06-04 DIAGNOSIS — J3089 Other allergic rhinitis: Secondary | ICD-10-CM | POA: Diagnosis not present

## 2020-06-04 DIAGNOSIS — J309 Allergic rhinitis, unspecified: Secondary | ICD-10-CM | POA: Insufficient documentation

## 2020-06-04 DIAGNOSIS — F419 Anxiety disorder, unspecified: Secondary | ICD-10-CM

## 2020-06-04 DIAGNOSIS — E782 Mixed hyperlipidemia: Secondary | ICD-10-CM

## 2020-06-04 MED ORDER — LISINOPRIL-HYDROCHLOROTHIAZIDE 10-12.5 MG PO TABS: 1.0000 | ORAL_TABLET | Freq: Every day | ORAL | 1 refills | Status: DC

## 2020-06-04 MED ORDER — CITALOPRAM HYDROBROMIDE 20 MG PO TABS: 20.0000 mg | ORAL_TABLET | Freq: Every day | ORAL | 1 refills | Status: DC

## 2020-06-04 MED ORDER — TRIAMCINOLONE ACETONIDE 40 MG/ML IJ SUSP
60.0000 mg | Freq: Once | INTRAMUSCULAR | Status: AC
  Administered 2020-06-04: 60 mg via INTRAMUSCULAR

## 2020-06-04 NOTE — Assessment & Plan Note (Signed)
Continue current meds as directed 

## 2020-06-04 NOTE — Assessment & Plan Note (Signed)
Well controlled.  ?No changes to medicines.  ?Continue to work on eating a healthy diet and exercise.  ?Labs drawn today.  ?

## 2020-06-04 NOTE — Assessment & Plan Note (Signed)
labwork pending Continue to watch diet

## 2020-06-04 NOTE — Assessment & Plan Note (Signed)
Continue claritin qd Kenalog 60mg  given IM

## 2020-06-04 NOTE — Progress Notes (Signed)
Acute Office Visit  Subjective:    Patient ID: Allison Baldwin, female    DOB: 09-25-1976, 44 y.o.   MRN: [id number]  Chief Complaint  Patient presents with   Hypertension    fasting follow up    HPI Patient is in today for hypertension  Pt presents for follow up of hypertension.The patient is tolerating the medication well without side effects. Compliance with treatment has been good; including taking medication as directed , maintains a healthy diet and regular exercise regimen , and following up as directed. Patient was evaluated using exam, physical, labs and other information to perform evidence based decision making. She is currently taking lisinopril / hctz 10/12.5mg  qd  Pt with history of anxiety - she is doing well on citalopram 20mg  qd - voices no concerns or problems  Pt is due to check lipid panel - has history of elevated in past but not currently on medication  Pt with history of allergies - having a lot of clear drainage, nasal stuffiness and congestion Is taking claritin and alternating with benadryl Past Medical History:  Diagnosis Date   Depression    Hyperlipidemia    Hypertension    Vitamin D deficiency     Past Surgical History:  Procedure Laterality Date   BACK SURGERY  2008   BREAST REDUCTION SURGERY Bilateral 02/20/2020   Procedure: BILATERAL BREAST REDUCTION;  Surgeon: 02/22/2020, MD;  Location: [facility];  Service: Plastics;  Laterality: Bilateral;    Family History  Problem Relation Age of Onset   Hypothyroidism Mother    Congestive Heart Failure Father    Breast cancer Paternal Grandmother     Social History   Socioeconomic History   Marital status: Single    Spouse name: Not on file   Number of children: Not on file   Years of education: Not on file   Highest education level: Not on file  Occupational History   Not on file  Tobacco Use   Smoking status: Never Smoker   Smokeless tobacco:  Never Used  Substance and Sexual Activity   Alcohol use: Never   Drug use: Never   Sexual activity: Not on file  Other Topics Concern   Not on file  Social History Narrative   Not on file   Social Determinants of Health   Financial Resource Strain:    Difficulty of Paying Living Expenses:   Food Insecurity:    Worried About Glenna Fellows in the Last Year:    Programme researcher, broadcasting/film/video in the Last Year:   Transportation Needs:    Barista (Medical):    Lack of Transportation (Non-Medical):   Physical Activity:    Days of Exercise per Week:    Minutes of Exercise per Session:   Stress:    Feeling of Stress :   Social Connections:    Frequency of Communication with Friends and Family:    Frequency of Social Gatherings with Friends and Family:    Attends Religious Services:    Active Member of Clubs or Organizations:    Attends Freight forwarder:    Marital Status:   Intimate Partner Violence:    Fear of Current or Ex-Partner:    Emotionally Abused:    Physically Abused:    Sexually Abused:      Current Outpatient Medications:    citalopram (CELEXA) 20 MG tablet, Take 1 tablet (20 mg total) by mouth daily., Disp: 90 tablet, Rfl:  1   lisinopril-hydrochlorothiazide (ZESTORETIC) 10-12.5 MG tablet, Take 1 tablet by mouth daily., Disp: 90 tablet, Rfl: 1  Current Facility-Administered Medications:    triamcinolone acetonide (KENALOG-40) injection 60 mg, 60 mg, Intramuscular, Once, Marianne Sofia, PA-C   No Known Allergies  CONSTITUTIONAL: Negative for chills, fatigue, fever, unintentional weight gain and unintentional weight loss.  E/N/T: see HPI CARDIOVASCULAR: Negative for chest pain, dizziness, palpitations and pedal edema.  RESPIRATORY: Negative for recent cough and dyspnea.  GASTROINTESTINAL: Negative for abdominal pain, acid reflux symptoms, constipation, diarrhea, nausea and vomiting.  MSK: Negative for arthralgias and  myalgias.  INTEGUMENTARY: Negative for rash.  NEUROLOGICAL: Negative for dizziness and headaches.  PSYCHIATRIC: Negative for sleep disturbance and to question depression screen.  Negative for depression, negative for anhedonia.         Objective:    PHYSICAL EXAM:   VS: BP 128/84 (BP Location: Left Arm, Patient Position: Sitting)    Pulse 72    Temp 97.8 F (36.6 C) (Temporal)    Wt 251 lb 12.8 oz (114.2 kg)    SpO2 98%    BMI 43.22 kg/m   GEN: Well nourished, well developed, in no acute distress  HEENT - TMS normal - pharynx with mild erythema Cardiac: RRR; no murmurs, rubs, or gallops,no edema - no significant varicosities Respiratory:  normal respiratory rate and pattern with no distress - normal breath sounds with no rales, rhonchi, wheezes or rubs  Neuro:  Alert and Oriented x 3, Strength and sensation are intact - CN II-Xii grossly intact Psych: euthymic mood, appropriate affect and demeanor   Wt Readings from Last 3 Encounters:  06/04/20 251 lb 12.8 oz (114.2 kg)  02/20/20 251 lb 12.3 oz (114.2 kg)  01/19/20 253 lb 3.2 oz (114.9 kg)    Health Maintenance Due  Topic Date Due   Hepatitis C Screening  Never done   HIV Screening  Never done   TETANUS/TDAP  Never done   PAP SMEAR-Modifier  Never done   INFLUENZA VACCINE  06/02/2020    There are no preventive care reminders to display for this patient.        Assessment & Plan:   Problem List Items Addressed This Visit      Cardiovascular and Mediastinum   Benign hypertension - Primary    Well controlled.  No changes to medicines.  Continue to work on eating a healthy diet and exercise.  Labs drawn today.        Relevant Medications   lisinopril-hydrochlorothiazide (ZESTORETIC) 10-12.5 MG tablet   Other Relevant Orders   CBC with Differential/Platelet   Comprehensive metabolic panel     Respiratory   Allergic rhinitis due to allergen    Continue claritin qd Kenalog 60mg  given IM       Relevant Medications   triamcinolone acetonide (KENALOG-40) injection 60 mg (Start on 06/04/2020  9:30 AM)     Other   Anxiety    Continue current meds as directed      Relevant Medications   citalopram (CELEXA) 20 MG tablet   Mixed hyperlipidemia    labwork pending Continue to watch diet      Relevant Medications   lisinopril-hydrochlorothiazide (ZESTORETIC) 10-12.5 MG tablet   Other Relevant Orders   Lipid panel       Meds ordered this encounter  Medications   triamcinolone acetonide (KENALOG-40) injection 60 mg   citalopram (CELEXA) 20 MG tablet    Sig: Take 1 tablet (20 mg total) by mouth daily.  Dispense:  90 tablet    Refill:  1    Order Specific Question:   Supervising Provider    Answer:   Corey Harold   lisinopril-hydrochlorothiazide (ZESTORETIC) 10-12.5 MG tablet    Sig: Take 1 tablet by mouth daily.    Dispense:  90 tablet    Refill:  1    Order Specific Question:   Supervising Provider    Answer:   COX, KIRSTEN [[id number]]     SARA R Dalphine Cowie, PA-C

## 2020-06-05 ENCOUNTER — Other Ambulatory Visit: Payer: Self-pay | Admitting: Physician Assistant

## 2020-06-05 LAB — COMPREHENSIVE METABOLIC PANEL
ALT: 12 IU/L (ref 0–32)
AST: 15 IU/L (ref 0–40)
Albumin/Globulin Ratio: 1.4 (ref 1.2–2.2)
Albumin: 4 g/dL (ref 3.8–4.8)
Alkaline Phosphatase: 110 IU/L (ref 48–121)
BUN/Creatinine Ratio: 8 — ABNORMAL LOW (ref 9–23)
BUN: 7 mg/dL (ref 6–24)
Bilirubin Total: 0.3 mg/dL (ref 0.0–1.2)
CO2: 25 mmol/L (ref 20–29)
Calcium: 9.2 mg/dL (ref 8.7–10.2)
Chloride: 105 mmol/L (ref 96–106)
Creatinine, Ser: 0.87 mg/dL (ref 0.57–1.00)
GFR calc Af Amer: 95 mL/min/{1.73_m2} (ref >59)
GFR calc non Af Amer: 82 mL/min/{1.73_m2} (ref >59)
Globulin, Total: 2.9 g/dL (ref 1.5–4.5)
Glucose: 89 mg/dL (ref 65–99)
Potassium: 4.1 mmol/L (ref 3.5–5.2)
Sodium: 140 mmol/L (ref 134–144)
Total Protein: 6.9 g/dL (ref 6.0–8.5)

## 2020-06-05 LAB — CBC WITH DIFFERENTIAL/PLATELET
Basophils Absolute: 0 10*3/uL (ref 0.0–0.2)
Basos: 1 %
EOS (ABSOLUTE): 0.6 10*3/uL — ABNORMAL HIGH (ref 0.0–0.4)
Eos: 8 %
Hematocrit: 38.7 % (ref 34.0–46.6)
Hemoglobin: 12.8 g/dL (ref 11.1–15.9)
Immature Grans (Abs): 0 10*3/uL (ref 0.0–0.1)
Immature Granulocytes: 0 %
Lymphocytes Absolute: 1.6 10*3/uL (ref 0.7–3.1)
Lymphs: 21 %
MCH: 28 pg (ref 26.6–33.0)
MCHC: 33.1 g/dL (ref 31.5–35.7)
MCV: 85 fL (ref 79–97)
Monocytes Absolute: 0.4 10*3/uL (ref 0.1–0.9)
Monocytes: 5 %
Neutrophils Absolute: 4.9 10*3/uL (ref 1.4–7.0)
Neutrophils: 65 %
Platelets: 259 10*3/uL (ref 150–450)
RBC: 4.57 x10E6/uL (ref 3.77–5.28)
RDW: 13 % (ref 11.7–15.4)
WBC: 7.4 10*3/uL (ref 3.4–10.8)

## 2020-06-05 LAB — LIPID PANEL
Chol/HDL Ratio: 6.3 ratio — ABNORMAL HIGH (ref 0.0–4.4)
Cholesterol, Total: 228 mg/dL — ABNORMAL HIGH (ref 100–199)
HDL: 36 mg/dL — ABNORMAL LOW (ref >39)
LDL Chol Calc (NIH): 164 mg/dL — ABNORMAL HIGH (ref 0–99)
Triglycerides: 151 mg/dL — ABNORMAL HIGH (ref 0–149)
VLDL Cholesterol Cal: 28 mg/dL (ref 5–40)

## 2020-06-05 LAB — CARDIOVASCULAR RISK ASSESSMENT

## 2020-06-05 MED ORDER — ROSUVASTATIN CALCIUM 5 MG PO TABS: 5.0000 mg | ORAL_TABLET | Freq: Every day | ORAL | 1 refills | Status: DC

## 2020-06-07 ENCOUNTER — Telehealth: Payer: Self-pay

## 2020-06-07 NOTE — Telephone Encounter (Signed)
Patient left a voicemail stating she "is not going to take the medication for her LDL"... "is going to work on her diet and exercise, and has a 3 month f/u."

## 2020-09-08 NOTE — Progress Notes (Deleted)
Subjective:  Patient ID: Allison Baldwin, female    DOB: 1976-09-05  Age: 44 y.o. MRN: [id number]  Chief Complaint  Patient presents with  . Hypertension    3 month fasting follow up    HPI   Current Outpatient Medications on File Prior to Visit  Medication Sig Dispense Refill  . citalopram (CELEXA) 20 MG tablet Take 1 tablet (20 mg total) by mouth daily. 90 tablet 1  . lisinopril-hydrochlorothiazide (ZESTORETIC) 10-12.5 MG tablet Take 1 tablet by mouth daily. 90 tablet 1  . rosuvastatin (CRESTOR) 5 MG tablet Take 1 tablet (5 mg total) by mouth daily. 30 tablet 1   No current facility-administered medications on file prior to visit.   Past Medical History:  Diagnosis Date  . Depression   . Hyperlipidemia   . Hypertension   . Vitamin D deficiency    Past Surgical History:  Procedure Laterality Date  . BACK SURGERY  2008  . BREAST REDUCTION SURGERY Bilateral 02/20/2020   Procedure: BILATERAL BREAST REDUCTION;  Surgeon: Glenna Fellows, MD;  Location: [facility];  Service: Plastics;  Laterality: Bilateral;    Family History  Problem Relation Age of Onset  . Hypothyroidism Mother   . Congestive Heart Failure Father   . Breast cancer Paternal Grandmother    Social History   Socioeconomic History  . Marital status: Single    Spouse name: Not on file  . Number of children: Not on file  . Years of education: Not on file  . Highest education level: Not on file  Occupational History  . Not on file  Tobacco Use  . Smoking status: Never Smoker  . Smokeless tobacco: Never Used  Substance and Sexual Activity  . Alcohol use: Never  . Drug use: Never  . Sexual activity: Not on file  Other Topics Concern  . Not on file  Social History Narrative  . Not on file   Social Determinants of Health   Financial Resource Strain:   . Difficulty of Paying Living Expenses: Not on file  Food Insecurity:   . Worried About Programme researcher, broadcasting/film/video in the Last Year: Not  on file  . Ran Out of Food in the Last Year: Not on file  Transportation Needs:   . Lack of Transportation (Medical): Not on file  . Lack of Transportation (Non-Medical): Not on file  Physical Activity:   . Days of Exercise per Week: Not on file  . Minutes of Exercise per Session: Not on file  Stress:   . Feeling of Stress : Not on file  Social Connections:   . Frequency of Communication with Friends and Family: Not on file  . Frequency of Social Gatherings with Friends and Family: Not on file  . Attends Religious Services: Not on file  . Active Member of Clubs or Organizations: Not on file  . Attends Banker Meetings: Not on file  . Marital Status: Not on file    Review of Systems  Constitutional: Negative for fatigue and fever.  HENT: Negative for congestion, ear pain, sinus pressure and sore throat.   Eyes: Negative for pain.  Respiratory: Negative for cough, chest tightness, shortness of breath and wheezing.   Cardiovascular: Negative for chest pain and palpitations.  Gastrointestinal: Negative for abdominal pain, constipation, diarrhea, nausea and vomiting.  Genitourinary: Negative for dysuria and hematuria.  Musculoskeletal: Negative for arthralgias, back pain, joint swelling and myalgias.  Skin: Negative for rash.  Neurological: Negative for dizziness,  weakness and headaches.  Psychiatric/Behavioral: Negative for dysphoric mood. The patient is not nervous/anxious.      Objective:  There were no vitals taken for this visit.  BP/Weight 06/04/2020 02/20/2020 01/19/2020  Systolic BP 128 130 128  Diastolic BP 84 78 84  Wt. (Lbs) 251.8 251.77 253.2  BMI 43.22 43.22 43.46    Physical Exam Constitutional:      Appearance: Normal appearance.  HENT:     Right Ear: Tympanic membrane, ear canal and external ear normal.     Left Ear: Tympanic membrane, ear canal and external ear normal.     Nose: Nose normal.     Mouth/Throat:     Mouth: Mucous membranes are  moist.  Cardiovascular:     Rate and Rhythm: Normal rate and regular rhythm.     Pulses: Normal pulses.     Heart sounds: Normal heart sounds.  Pulmonary:     Effort: Pulmonary effort is normal.     Breath sounds: Normal breath sounds.  Abdominal:     Palpations: Abdomen is soft.  Musculoskeletal:        General: Normal range of motion.     Cervical back: Normal range of motion.  Skin:    General: Skin is warm and dry.  Neurological:     Mental Status: She is oriented to person, place, and time.  Psychiatric:        Mood and Affect: Mood normal.        Behavior: Behavior normal.        Thought Content: Thought content normal.        Judgment: Judgment normal.     Diabetic Foot Exam - Simple   No data filed       Lab Results  Component Value Date   WBC 7.4 06/04/2020   HGB 12.8 06/04/2020   HCT 38.7 06/04/2020   PLT 259 06/04/2020   GLUCOSE 89 06/04/2020   CHOL 228 (H) 06/04/2020   TRIG 151 (H) 06/04/2020   HDL 36 (L) 06/04/2020   LDLCALC 164 (H) 06/04/2020   ALT 12 06/04/2020   AST 15 06/04/2020   NA 140 06/04/2020   K 4.1 06/04/2020   CL 105 06/04/2020   CREATININE 0.87 06/04/2020   BUN 7 06/04/2020   CO2 25 06/04/2020      Assessment & Plan:   1. Benign hypertension  2. Mixed hyperlipidemia  3. Anxiety    No orders of the defined types were placed in this encounter.   No orders of the defined types were placed in this encounter.    Follow-up: No follow-ups on file.  An After Visit Summary was printed and given to the patient.  Precious Reel [facility] [phone]

## 2020-09-09 ENCOUNTER — Ambulatory Visit (INDEPENDENT_AMBULATORY_CARE_PROVIDER_SITE_OTHER): Payer: PRIVATE HEALTH INSURANCE | Admitting: Nurse Practitioner

## 2020-09-09 ENCOUNTER — Ambulatory Visit: Payer: PRIVATE HEALTH INSURANCE | Admitting: Physician Assistant

## 2020-09-09 DIAGNOSIS — I10 Essential (primary) hypertension: Secondary | ICD-10-CM

## 2020-09-09 DIAGNOSIS — F419 Anxiety disorder, unspecified: Secondary | ICD-10-CM

## 2020-09-09 DIAGNOSIS — R5383 Other fatigue: Secondary | ICD-10-CM

## 2020-09-09 DIAGNOSIS — E782 Mixed hyperlipidemia: Secondary | ICD-10-CM

## 2020-11-21 ENCOUNTER — Other Ambulatory Visit: Payer: Self-pay

## 2020-11-21 ENCOUNTER — Encounter: Payer: Self-pay | Admitting: Physician Assistant

## 2020-11-21 ENCOUNTER — Ambulatory Visit: Payer: PRIVATE HEALTH INSURANCE | Admitting: Physician Assistant

## 2020-11-21 VITALS — BP 126/78 | HR 69 | Temp 97.5°F | Ht 64.0 in | Wt 251.0 lb

## 2020-11-21 DIAGNOSIS — Z23 Encounter for immunization: Secondary | ICD-10-CM | POA: Diagnosis not present

## 2020-11-21 DIAGNOSIS — R5383 Other fatigue: Secondary | ICD-10-CM | POA: Diagnosis not present

## 2020-11-21 DIAGNOSIS — E782 Mixed hyperlipidemia: Secondary | ICD-10-CM | POA: Diagnosis not present

## 2020-11-21 DIAGNOSIS — J3089 Other allergic rhinitis: Secondary | ICD-10-CM

## 2020-11-21 DIAGNOSIS — I10 Essential (primary) hypertension: Secondary | ICD-10-CM

## 2020-11-21 DIAGNOSIS — F419 Anxiety disorder, unspecified: Secondary | ICD-10-CM

## 2020-11-21 MED ORDER — LISINOPRIL-HYDROCHLOROTHIAZIDE 10-12.5 MG PO TABS: 1.0000 | ORAL_TABLET | Freq: Every day | ORAL | 1 refills | Status: DC

## 2020-11-21 MED ORDER — CITALOPRAM HYDROBROMIDE 20 MG PO TABS: 20.0000 mg | ORAL_TABLET | Freq: Every day | ORAL | 1 refills | Status: DC

## 2020-11-21 MED ORDER — FLUTICASONE PROPIONATE 50 MCG/ACT NA SUSP: 2.0000 | Freq: Every day | NASAL | 6 refills | Status: DC

## 2020-11-21 NOTE — Progress Notes (Signed)
Subjective:  Patient ID: Allison Baldwin, female    DOB: 1975/11/30  Age: 45 y.o. MRN: [id number]  Chief Complaint  Patient presents with  . Hypertension    HPI  Pt presents for follow up of hypertension.  The patient is tolerating the medication well without side effects. Compliance with treatment has been good; including taking medication as directed , maintains a healthy diet and regular exercise regimen , and following up as directed. Taking zestoretic 10/12.5 qd  Pt with history of anxiety - currently stable on celexa 20mg  qd  Pt with history of hyperlipidemia - has not been taking crestor  Current Outpatient Medications on File Prior to Visit  Medication Sig Dispense Refill  . rosuvastatin (CRESTOR) 5 MG tablet Take 1 tablet (5 mg total) by mouth daily. 30 tablet 1   No current facility-administered medications on file prior to visit.   Past Medical History:  Diagnosis Date  . Depression   . Hyperlipidemia   . Hypertension   . Vitamin D deficiency    Past Surgical History:  Procedure Laterality Date  . BACK SURGERY  2008  . BREAST REDUCTION SURGERY Bilateral 02/20/2020   Procedure: BILATERAL BREAST REDUCTION;  Surgeon: 02/22/2020, MD;  Location: [facility];  Service: Plastics;  Laterality: Bilateral;    Family History  Problem Relation Age of Onset  . Hypothyroidism Mother   . Congestive Heart Failure Father   . Breast cancer Paternal Grandmother    Social History   Socioeconomic History  . Marital status: Single    Spouse name: Not on file  . Number of children: Not on file  . Years of education: Not on file  . Highest education level: Not on file  Occupational History  . Not on file  Tobacco Use  . Smoking status: Never Smoker  . Smokeless tobacco: Never Used  Substance and Sexual Activity  . Alcohol use: Never  . Drug use: Never  . Sexual activity: Not on file  Other Topics Concern  . Not on file  Social History Narrative  .  Not on file   Social Determinants of Health   Financial Resource Strain: Not on file  Food Insecurity: Not on file  Transportation Needs: Not on file  Physical Activity: Not on file  Stress: Not on file  Social Connections: Not on file    Review of Systems CONSTITUTIONAL: has had some malaise for over a month E/N/T: Negative for ear pain, nasal congestion and sore throat.  CARDIOVASCULAR: Negative for chest pain, dizziness, palpitations and pedal edema.  RESPIRATORY: Negative for recent cough and dyspnea.  GASTROINTESTINAL: Negative for abdominal pain, acid reflux symptoms, constipation, diarrhea, nausea and vomiting.  MSK: Negative for arthralgias and myalgias.  INTEGUMENTARY: Negative for rash.  NEUROLOGICAL: Negative for dizziness and headaches.  PSYCHIATRIC: Negative for sleep disturbance and to question depression screen.  Negative for depression, negative for anhedonia.       Objective:  BP 126/78 (BP Location: Left Arm, Patient Position: Sitting, Cuff Size: Normal)   Pulse 69   Temp (!) 97.5 F (36.4 C) (Temporal)   Ht 5\' 4"  (1.626 m)   Wt 251 lb (113.9 kg)   SpO2 98%   BMI 43.08 kg/m   BP/Weight 11/21/2020 06/04/2020 02/20/2020  Systolic BP 126 128 130  Diastolic BP 78 84 78  Wt. (Lbs) 251 251.8 251.77  BMI 43.08 43.22 43.22    Physical Exam PHYSICAL EXAM:   VS: BP 126/78 (BP Location: Left  Arm, Patient Position: Sitting, Cuff Size: Normal)   Pulse 69   Temp (!) 97.5 F (36.4 C) (Temporal)   Ht 5\' 4"  (1.626 m)   Wt 251 lb (113.9 kg)   SpO2 98%   BMI 43.08 kg/m   GEN: Well nourished, well developed, in no acute distress  HEENT: normal external ears and nose - normal external auditory canals and TMS - hearing grossly normal - normal nasal mucosa and septum - Lips, Teeth and Gums - normal  Oropharynx - normal mucosa, palate, and posterior pharynx  Cardiac: RRR; no murmurs, rubs, or gallops,no edema - Respiratory:  normal respiratory rate and pattern with  no distress - normal breath sounds with no rales, rhonchi, wheezes or rubs te affect and demeanor  Diabetic Foot Exam - Simple   No data filed      Lab Results  Component Value Date   WBC 7.4 06/04/2020   HGB 12.8 06/04/2020   HCT 38.7 06/04/2020   PLT 259 06/04/2020   GLUCOSE 89 06/04/2020   CHOL 228 (H) 06/04/2020   TRIG 151 (H) 06/04/2020   HDL 36 (L) 06/04/2020   LDLCALC 164 (H) 06/04/2020   ALT 12 06/04/2020   AST 15 06/04/2020   NA 140 06/04/2020   K 4.1 06/04/2020   CL 105 06/04/2020   CREATININE 0.87 06/04/2020   BUN 7 06/04/2020   CO2 25 06/04/2020      Assessment & Plan:   1. Other fatigue - CBC with Differential/Platelet - Comprehensive metabolic panel - TSH  2. Mixed hyperlipidemia - Lipid panel  3. Benign hypertension - CBC with Differential/Platelet - Comprehensive metabolic panel - TSH - lisinopril-hydrochlorothiazide (ZESTORETIC) 10-12.5 MG tablet; Take 1 tablet by mouth daily.  Dispense: 90 tablet; Refill: 1  4. Seasonal allergic rhinitis due to other allergic trigger - fluticasone (FLONASE) 50 MCG/ACT nasal spray; Place 2 sprays into both nostrils daily.  Dispense: 16 g; Refill: 6  5. Anxiety - citalopram (CELEXA) 20 MG tablet; Take 1 tablet (20 mg total) by mouth daily.  Dispense: 90 tablet; Refill: 1  6. Need for tetanus booster - Tdap vaccine greater than or equal to 7yo IM    Meds ordered this encounter  Medications  . fluticasone (FLONASE) 50 MCG/ACT nasal spray    Sig: Place 2 sprays into both nostrils daily.    Dispense:  16 g    Refill:  6    Order Specific Question:   Supervising Provider    Answer10/01/2020 Blane Ohara  . citalopram (CELEXA) 20 MG tablet    Sig: Take 1 tablet (20 mg total) by mouth daily.    Dispense:  90 tablet    Refill:  1    Order Specific Question:   Supervising Provider    [id number] Blane Ohara  . lisinopril-hydrochlorothiazide (ZESTORETIC) 10-12.5 MG tablet    Sig: Take 1 tablet by  mouth daily.    Dispense:  90 tablet    Refill:  1    Order Specific Question:   Supervising Provider    [id number]    Orders Placed This Encounter  Procedures  . Tdap vaccine greater than or equal to 7yo IM  . CBC with Differential/Platelet  . Comprehensive metabolic panel  . TSH  . Lipid panel       Follow-up: No follow-ups on file.  An After Visit Summary was printed and given to the patient.  SARA R Exilda Wilhite, PA-C  [facility] [phone]

## 2020-11-22 LAB — CBC WITH DIFFERENTIAL/PLATELET
Basophils Absolute: 0 10*3/uL (ref 0.0–0.2)
Basos: 0 %
EOS (ABSOLUTE): 0.4 10*3/uL (ref 0.0–0.4)
Eos: 5 %
Hematocrit: 41 % (ref 34.0–46.6)
Hemoglobin: 13.7 g/dL (ref 11.1–15.9)
Immature Grans (Abs): 0 10*3/uL (ref 0.0–0.1)
Immature Granulocytes: 0 %
Lymphocytes Absolute: 1.6 10*3/uL (ref 0.7–3.1)
Lymphs: 20 %
MCH: 27.6 pg (ref 26.6–33.0)
MCHC: 33.4 g/dL (ref 31.5–35.7)
MCV: 83 fL (ref 79–97)
Monocytes Absolute: 0.4 10*3/uL (ref 0.1–0.9)
Monocytes: 5 %
Neutrophils Absolute: 5.5 10*3/uL (ref 1.4–7.0)
Neutrophils: 70 %
Platelets: 250 10*3/uL (ref 150–450)
RBC: 4.97 x10E6/uL (ref 3.77–5.28)
RDW: 13.7 % (ref 11.7–15.4)
WBC: 8 10*3/uL (ref 3.4–10.8)

## 2020-11-22 LAB — COMPREHENSIVE METABOLIC PANEL
ALT: 11 IU/L (ref 0–32)
AST: 12 IU/L (ref 0–40)
Albumin/Globulin Ratio: 1.3 (ref 1.2–2.2)
Albumin: 3.8 g/dL (ref 3.8–4.8)
Alkaline Phosphatase: 92 IU/L (ref 44–121)
BUN/Creatinine Ratio: 10 (ref 9–23)
BUN: 7 mg/dL (ref 6–24)
Bilirubin Total: <0.2 mg/dL (ref 0.0–1.2)
CO2: 24 mmol/L (ref 20–29)
Calcium: 9.1 mg/dL (ref 8.7–10.2)
Chloride: 103 mmol/L (ref 96–106)
Creatinine, Ser: 0.7 mg/dL (ref 0.57–1.00)
GFR calc Af Amer: 124 mL/min/{1.73_m2} (ref >59)
GFR calc non Af Amer: 107 mL/min/{1.73_m2} (ref >59)
Globulin, Total: 2.9 g/dL (ref 1.5–4.5)
Glucose: 92 mg/dL (ref 65–99)
Potassium: 4 mmol/L (ref 3.5–5.2)
Sodium: 139 mmol/L (ref 134–144)
Total Protein: 6.7 g/dL (ref 6.0–8.5)

## 2020-11-22 LAB — LIPID PANEL
Chol/HDL Ratio: 5.1 ratio — ABNORMAL HIGH (ref 0.0–4.4)
Cholesterol, Total: 218 mg/dL — ABNORMAL HIGH (ref 100–199)
HDL: 43 mg/dL (ref >39)
LDL Chol Calc (NIH): 154 mg/dL — ABNORMAL HIGH (ref 0–99)
Triglycerides: 115 mg/dL (ref 0–149)
VLDL Cholesterol Cal: 21 mg/dL (ref 5–40)

## 2020-11-22 LAB — TSH: TSH: 1.29 u[IU]/mL (ref 0.450–4.500)

## 2020-11-22 LAB — CARDIOVASCULAR RISK ASSESSMENT

## 2020-12-05 ENCOUNTER — Ambulatory Visit: Payer: PRIVATE HEALTH INSURANCE | Admitting: Physician Assistant

## 2021-02-13 ENCOUNTER — Other Ambulatory Visit: Payer: Self-pay | Admitting: Physician Assistant

## 2021-05-21 ENCOUNTER — Ambulatory Visit: Payer: PRIVATE HEALTH INSURANCE | Admitting: Physician Assistant

## 2021-07-09 ENCOUNTER — Other Ambulatory Visit: Payer: Self-pay | Admitting: Physician Assistant

## 2021-07-09 DIAGNOSIS — F419 Anxiety disorder, unspecified: Secondary | ICD-10-CM

## 2021-07-09 DIAGNOSIS — I10 Essential (primary) hypertension: Secondary | ICD-10-CM

## 2021-07-22 ENCOUNTER — Encounter: Payer: Self-pay | Admitting: Physician Assistant

## 2021-07-22 ENCOUNTER — Other Ambulatory Visit: Payer: Self-pay

## 2021-07-22 ENCOUNTER — Ambulatory Visit: Payer: No Typology Code available for payment source | Admitting: Physician Assistant

## 2021-07-22 VITALS — BP 116/82 | HR 91 | Temp 97.8°F | Ht 64.0 in | Wt 265.8 lb

## 2021-07-22 DIAGNOSIS — E782 Mixed hyperlipidemia: Secondary | ICD-10-CM | POA: Diagnosis not present

## 2021-07-22 DIAGNOSIS — I10 Essential (primary) hypertension: Secondary | ICD-10-CM

## 2021-07-22 DIAGNOSIS — F419 Anxiety disorder, unspecified: Secondary | ICD-10-CM | POA: Diagnosis not present

## 2021-07-22 DIAGNOSIS — L304 Erythema intertrigo: Secondary | ICD-10-CM

## 2021-07-22 MED ORDER — PHENTERMINE HCL 37.5 MG PO TABS: 37.5000 mg | ORAL_TABLET | Freq: Every day | ORAL | 1 refills | Status: DC

## 2021-07-22 MED ORDER — CLOTRIMAZOLE-BETAMETHASONE 1-0.05 % EX CREA: 1.0000 "application " | TOPICAL_CREAM | Freq: Two times a day (BID) | CUTANEOUS | 0 refills | Status: DC

## 2021-07-22 NOTE — Progress Notes (Signed)
Acute Office Visit  Subjective:    Patient ID: Allison Baldwin, female    DOB: October 12, 1976, 45 y.o.   MRN: [id number]  Chief Complaint  Patient presents with  . Hyperlipidemia  . Hypertension    Hypertension  Hyperlipidemia  Patient is in today for hypertension  Pt presents for follow up of hypertension.The patient is tolerating the medication well without side effects. Compliance with treatment has been good; including taking medication as directed , maintains a healthy diet and regular exercise regimen , and following up as directed. Patient was evaluated using exam, physical, labs and other information to perform evidence based decision making. She is currently taking lisinopril / hctz 10/12.5mg  qd  Pt with history of anxiety - she is doing well on citalopram 20mg  qd - voices no concerns or problems  Pt is due to check lipid panel - pt was given crestor 5mg  qd but stopped medication - states will try to start watching diet She would like to try adipex again as well to help with weight loss - states she will try to watch diet and start to exercise  Pt with red rash under stomach and on legs - inflamed and burns Past Medical History:  Diagnosis Date  . Depression   . Hyperlipidemia   . Hypertension   . Vitamin D deficiency     Past Surgical History:  Procedure Laterality Date  . BACK SURGERY  2008  . BREAST REDUCTION SURGERY Bilateral 02/20/2020   Procedure: BILATERAL BREAST REDUCTION;  Surgeon: 2009, MD;  Location: [facility];  Service: Plastics;  Laterality: Bilateral;    Family History  Problem Relation Age of Onset  . Hypothyroidism Mother   . Congestive Heart Failure Father   . Breast cancer Paternal Grandmother     Social History   Socioeconomic History  . Marital status: Single    Spouse name: Not on file  . Number of children: Not on file  . Years of education: Not on file  . Highest education level: Not on file   Occupational History  . Not on file  Tobacco Use  . Smoking status: Never  . Smokeless tobacco: Never  Substance and Sexual Activity  . Alcohol use: Never  . Drug use: Never  . Sexual activity: Not on file  Other Topics Concern  . Not on file  Social History Narrative  . Not on file   Social Determinants of Health   Financial Resource Strain: Not on file  Food Insecurity: Not on file  Transportation Needs: Not on file  Physical Activity: Not on file  Stress: Not on file  Social Connections: Not on file  Intimate Partner Violence: Not on file     Current Outpatient Medications:  .  citalopram (CELEXA) 20 MG tablet, Take 1 tablet by mouth once daily, Disp: 30 tablet, Rfl: 0 .  clotrimazole-betamethasone (LOTRISONE) cream, Apply 1 application topically 2 (two) times daily., Disp: 30 g, Rfl: 0 .  lisinopril-hydrochlorothiazide (ZESTORETIC) 10-12.5 MG tablet, Take 1 tablet by mouth once daily, Disp: 30 tablet, Rfl: 0 .  phentermine (ADIPEX-P) 37.5 MG tablet, Take 1 tablet (37.5 mg total) by mouth daily before breakfast., Disp: 30 tablet, Rfl: 1   No Known Allergies  CONSTITUTIONAL: Negative for chills, fatigue, fever, unintentional weight gain and unintentional weight loss.  CARDIOVASCULAR: Negative for chest pain, dizziness, palpitations and pedal edema.  RESPIRATORY: Negative for recent cough and dyspnea.  GASTROINTESTINAL: Negative for abdominal pain, acid reflux symptoms, constipation,  diarrhea, nausea and vomiting.  MSK: Negative for arthralgias and myalgias.  INTEGUMENTARY:see HPI NEUROLOGICAL: Negative for dizziness and headaches.  PSYCHIATRIC: Negative for sleep disturbance and to question depression screen.  Negative for depression, negative for anhedonia.         Objective:   PHYSICAL EXAM:   VS: BP 116/82   Pulse 91   Temp 97.8 F (36.6 C)   Ht 5\' 4"  (1.626 m)   Wt 265 lb 12.8 oz (120.6 kg)   SpO2 96%   BMI 45.62 kg/m   GEN: Well nourished, well  developed, in no acute distress  Cardiac: RRR; no murmurs, rubs, or gallops,no edema -  Respiratory:  normal respiratory rate and pattern with no distress - normal breath sounds with no rales, rhonchi, wheezes or rubs MS: no deformity or atrophy  Skin: intertrigo noted under stomach fold and groin area Neuro:  Alert and Oriented x 3,- CN II-Xii grossly intact Psych: euthymic mood, appropriate affect and demeanor   Wt Readings from Last 3 Encounters:  07/22/21 265 lb 12.8 oz (120.6 kg)  11/21/20 251 lb (113.9 kg)  06/04/20 251 lb 12.8 oz (114.2 kg)    Health Maintenance Due  Topic Date Due  . HIV Screening  Never done  . Hepatitis C Screening  Never done  . PAP SMEAR-Modifier  Never done  . COVID-19 Vaccine (3 - Booster for Moderna series) 05/02/2020  . INFLUENZA VACCINE  06/02/2021    There are no preventive care reminders to display for this patient.        Assessment & Plan:   Problem List Items Addressed This Visit       Cardiovascular and Mediastinum   Benign hypertension - Primary   Relevant Orders   CBC with Differential/Platelet   Comprehensive metabolic panel   TSH Continue meds     Other   Morbid obesity (HCC)   Relevant Medications   phentermine (ADIPEX-P) 37.5 MG tablet   Anxiety Continue meds   Mixed hyperlipidemia   Relevant Orders   Lipid panel Watch diet   Other Visit Diagnoses     Intertrigo       Relevant Medications   clotrimazole-betamethasone (LOTRISONE) cream        Meds ordered this encounter  Medications  . clotrimazole-betamethasone (LOTRISONE) cream    Sig: Apply 1 application topically 2 (two) times daily.    Dispense:  30 g    Refill:  0    Order Specific Question:   Supervising Provider    Answer10/11/2020 Blane Ohara  . phentermine (ADIPEX-P) 37.5 MG tablet    Sig: Take 1 tablet (37.5 mg total) by mouth daily before breakfast.    Dispense:  30 tablet    Refill:  1    Order Specific Question:   Supervising  Provider    Answer:   COX, [id number] Fritzi Mandes     SARA R Jacqui Headen, PA-C

## 2021-07-23 LAB — COMPREHENSIVE METABOLIC PANEL
ALT: 11 IU/L (ref 0–32)
AST: 15 IU/L (ref 0–40)
Albumin/Globulin Ratio: 1.7 (ref 1.2–2.2)
Albumin: 4.2 g/dL (ref 3.8–4.8)
Alkaline Phosphatase: 100 IU/L (ref 44–121)
BUN/Creatinine Ratio: 14 (ref 9–23)
BUN: 11 mg/dL (ref 6–24)
Bilirubin Total: 0.3 mg/dL (ref 0.0–1.2)
CO2: 22 mmol/L (ref 20–29)
Calcium: 9.4 mg/dL (ref 8.7–10.2)
Chloride: 102 mmol/L (ref 96–106)
Creatinine, Ser: 0.8 mg/dL (ref 0.57–1.00)
Globulin, Total: 2.5 g/dL (ref 1.5–4.5)
Glucose: 86 mg/dL (ref 65–99)
Potassium: 4.1 mmol/L (ref 3.5–5.2)
Sodium: 139 mmol/L (ref 134–144)
Total Protein: 6.7 g/dL (ref 6.0–8.5)
eGFR: 94 mL/min/{1.73_m2} (ref >59)

## 2021-07-23 LAB — CBC WITH DIFFERENTIAL/PLATELET
Basophils Absolute: 0 10*3/uL (ref 0.0–0.2)
Basos: 1 %
EOS (ABSOLUTE): 0.5 10*3/uL — ABNORMAL HIGH (ref 0.0–0.4)
Eos: 6 %
Hematocrit: 42.3 % (ref 34.0–46.6)
Hemoglobin: 14.2 g/dL (ref 11.1–15.9)
Immature Grans (Abs): 0 10*3/uL (ref 0.0–0.1)
Immature Granulocytes: 0 %
Lymphocytes Absolute: 1.7 10*3/uL (ref 0.7–3.1)
Lymphs: 21 %
MCH: 28.2 pg (ref 26.6–33.0)
MCHC: 33.6 g/dL (ref 31.5–35.7)
MCV: 84 fL (ref 79–97)
Monocytes Absolute: 0.5 10*3/uL (ref 0.1–0.9)
Monocytes: 6 %
Neutrophils Absolute: 5.4 10*3/uL (ref 1.4–7.0)
Neutrophils: 66 %
Platelets: 297 10*3/uL (ref 150–450)
RBC: 5.04 x10E6/uL (ref 3.77–5.28)
RDW: 13 % (ref 11.7–15.4)
WBC: 8.2 10*3/uL (ref 3.4–10.8)

## 2021-07-23 LAB — TSH: TSH: 2.04 u[IU]/mL (ref 0.450–4.500)

## 2021-07-23 LAB — LIPID PANEL
Chol/HDL Ratio: 6.3 ratio — ABNORMAL HIGH (ref 0.0–4.4)
Cholesterol, Total: 225 mg/dL — ABNORMAL HIGH (ref 100–199)
HDL: 36 mg/dL — ABNORMAL LOW (ref >39)
LDL Chol Calc (NIH): 150 mg/dL — ABNORMAL HIGH (ref 0–99)
Triglycerides: 211 mg/dL — ABNORMAL HIGH (ref 0–149)
VLDL Cholesterol Cal: 39 mg/dL (ref 5–40)

## 2021-07-23 LAB — CARDIOVASCULAR RISK ASSESSMENT

## 2021-08-09 ENCOUNTER — Other Ambulatory Visit: Payer: Self-pay | Admitting: Physician Assistant

## 2021-08-09 DIAGNOSIS — F419 Anxiety disorder, unspecified: Secondary | ICD-10-CM

## 2021-08-09 DIAGNOSIS — I10 Essential (primary) hypertension: Secondary | ICD-10-CM

## 2021-08-11 ENCOUNTER — Other Ambulatory Visit: Payer: Self-pay | Admitting: Physician Assistant

## 2021-09-23 ENCOUNTER — Ambulatory Visit: Payer: No Typology Code available for payment source | Admitting: Physician Assistant

## 2021-10-05 ENCOUNTER — Other Ambulatory Visit: Payer: Self-pay | Admitting: Physician Assistant

## 2021-10-05 DIAGNOSIS — F419 Anxiety disorder, unspecified: Secondary | ICD-10-CM

## 2021-10-21 ENCOUNTER — Other Ambulatory Visit: Payer: Self-pay

## 2021-10-21 ENCOUNTER — Encounter: Payer: Self-pay | Admitting: Physician Assistant

## 2021-10-21 ENCOUNTER — Ambulatory Visit: Payer: No Typology Code available for payment source | Admitting: Physician Assistant

## 2021-10-21 VITALS — BP 140/98 | HR 73 | Temp 97.3°F | Ht 64.0 in | Wt 251.8 lb

## 2021-10-21 DIAGNOSIS — E782 Mixed hyperlipidemia: Secondary | ICD-10-CM | POA: Diagnosis not present

## 2021-10-21 DIAGNOSIS — I10 Essential (primary) hypertension: Secondary | ICD-10-CM | POA: Diagnosis not present

## 2021-10-21 MED ORDER — LISINOPRIL-HYDROCHLOROTHIAZIDE 20-12.5 MG PO TABS: 1.0000 | ORAL_TABLET | Freq: Every day | ORAL | 0 refills | Status: DC

## 2021-10-21 NOTE — Progress Notes (Signed)
Subjective:  Patient ID: Allison Baldwin, female    DOB: Sep 23, 1976  Age: 45 y.o. MRN: [id number]  Chief Complaint  Patient presents with   Hypertension    HPI  Pt presents for follow up of hypertension. The patient is tolerating the medication well without side effects. Compliance with treatment has been fair - is taking meds but not watching diet Currently on zestoretic 10/12.5 She had been on adipex but has not taken in several weeks  Bp is elevated today  Pt with history of elevated lipids - has not been watching her diet - states if still elevated with todays labwork will consider taking medication Current Outpatient Medications on File Prior to Visit  Medication Sig Dispense Refill   citalopram (CELEXA) 20 MG tablet Take 1 tablet by mouth once daily 30 tablet 0   phentermine (ADIPEX-P) 37.5 MG tablet Take 1 tablet (37.5 mg total) by mouth daily before breakfast. 30 tablet 1   No current facility-administered medications on file prior to visit.   Past Medical History:  Diagnosis Date   Depression    Hyperlipidemia    Hypertension    Vitamin D deficiency    Past Surgical History:  Procedure Laterality Date   BACK SURGERY  2008   BREAST REDUCTION SURGERY Bilateral 02/20/2020   Procedure: BILATERAL BREAST REDUCTION;  Surgeon: Glenna Fellows, MD;  Location: [facility];  Service: Plastics;  Laterality: Bilateral;    Family History  Problem Relation Age of Onset   Hypothyroidism Mother    Congestive Heart Failure Father    Breast cancer Paternal Grandmother    Social History   Socioeconomic History   Marital status: Single    Spouse name: Not on file   Number of children: Not on file   Years of education: Not on file   Highest education level: Not on file  Occupational History   Not on file  Tobacco Use   Smoking status: Never   Smokeless tobacco: Never  Substance and Sexual Activity   Alcohol use: Never   Drug use: Never   Sexual activity:  Not on file  Other Topics Concern   Not on file  Social History Narrative   Not on file   Social Determinants of Health   Financial Resource Strain: Not on file  Food Insecurity: Not on file  Transportation Needs: Not on file  Physical Activity: Not on file  Stress: Not on file  Social Connections: Not on file    Review of Systems CONSTITUTIONAL: Negative for chills, fatigue, fever, unintentional weight gain and unintentional weight loss.  CARDIOVASCULAR: Negative for chest pain, dizziness, palpitations and pedal edema.  RESPIRATORY: Negative for recent cough and dyspnea.  GASTROINTESTINAL: Negative for abdominal pain, acid reflux symptoms, constipation, diarrhea, nausea and vomiting.    Objective:  PHYSICAL EXAM:   VS: BP (!) 140/98 (BP Location: Left Arm, Patient Position: Sitting, Cuff Size: Large)    Pulse 73    Temp (!) 97.3 F (36.3 C) (Temporal)    Ht 5\' 4"  (1.626 m)    Wt 251 lb 12.8 oz (114.2 kg)    SpO2 97%    BMI 43.22 kg/m   GEN: Well nourished, well developed, in no acute distress  Cardiac: RRR; no murmurs, rubs, or gallops,no edema -  Respiratory:  normal respiratory rate and pattern with no distress - normal breath sounds with no rales, rhonchi, wheezes or rubs  Psych: euthymic mood, appropriate affect and demeanor   Diabetic Foot  Exam - Simple   No data filed      Lab Results  Component Value Date   WBC 8.2 07/22/2021   HGB 14.2 07/22/2021   HCT 42.3 07/22/2021   PLT 297 07/22/2021   GLUCOSE 86 07/22/2021   CHOL 225 (H) 07/22/2021   TRIG 211 (H) 07/22/2021   HDL 36 (L) 07/22/2021   LDLCALC 150 (H) 07/22/2021   ALT 11 07/22/2021   AST 15 07/22/2021   NA 139 07/22/2021   K 4.1 07/22/2021   CL 102 07/22/2021   CREATININE 0.80 07/22/2021   BUN 11 07/22/2021   CO2 22 07/22/2021   TSH 2.040 07/22/2021      Assessment & Plan:   Problem List Items Addressed This Visit       Cardiovascular and Mediastinum   Benign hypertension - Primary    Relevant Medications   lisinopril-hydrochlorothiazide (ZESTORETIC) 20-12.5 MG tablet   Other Relevant Orders   Comprehensive metabolic panel   Lipid panel     Other   Mixed hyperlipidemia   Relevant Medications   lisinopril-hydrochlorothiazide (ZESTORETIC) 20-12.5 MG tablet   Other Relevant Orders   Comprehensive metabolic panel   Lipid panel  .  Meds ordered this encounter  Medications   lisinopril-hydrochlorothiazide (ZESTORETIC) 20-12.5 MG tablet    Sig: Take 1 tablet by mouth daily.    Dispense:  90 tablet    Refill:  0    Order Specific Question:   Supervising Provider    AnswerCorey Harold    Orders Placed This Encounter  Procedures   Comprehensive metabolic panel   Lipid panel     Follow-up: Return in about 3 months (around 01/19/2022) for chronic follow up ---- 1 month for bp check nurse visit. DO NOT TAKE ANY MORE ADIPEX AT THIS TIME An After Visit Summary was printed and given to the patient.  Jettie Pagan [facility] [phone]

## 2021-10-22 LAB — COMPREHENSIVE METABOLIC PANEL
ALT: 12 IU/L (ref 0–32)
AST: 12 IU/L (ref 0–40)
Albumin/Globulin Ratio: 1.8 (ref 1.2–2.2)
Albumin: 4.2 g/dL (ref 3.8–4.8)
Alkaline Phosphatase: 103 IU/L (ref 44–121)
BUN/Creatinine Ratio: 14 (ref 9–23)
BUN: 11 mg/dL (ref 6–24)
Bilirubin Total: 0.4 mg/dL (ref 0.0–1.2)
CO2: 24 mmol/L (ref 20–29)
Calcium: 9.3 mg/dL (ref 8.7–10.2)
Chloride: 106 mmol/L (ref 96–106)
Creatinine, Ser: 0.77 mg/dL (ref 0.57–1.00)
Globulin, Total: 2.4 g/dL (ref 1.5–4.5)
Glucose: 90 mg/dL (ref 70–99)
Potassium: 4.1 mmol/L (ref 3.5–5.2)
Sodium: 141 mmol/L (ref 134–144)
Total Protein: 6.6 g/dL (ref 6.0–8.5)
eGFR: 98 mL/min/{1.73_m2} (ref >59)

## 2021-10-22 LAB — LIPID PANEL
Chol/HDL Ratio: 6.5 ratio — ABNORMAL HIGH (ref 0.0–4.4)
Cholesterol, Total: 234 mg/dL — ABNORMAL HIGH (ref 100–199)
HDL: 36 mg/dL — ABNORMAL LOW (ref >39)
LDL Chol Calc (NIH): 171 mg/dL — ABNORMAL HIGH (ref 0–99)
Triglycerides: 147 mg/dL (ref 0–149)
VLDL Cholesterol Cal: 27 mg/dL (ref 5–40)

## 2021-10-22 LAB — CARDIOVASCULAR RISK ASSESSMENT

## 2021-10-30 ENCOUNTER — Other Ambulatory Visit: Payer: Self-pay | Admitting: Physician Assistant

## 2021-10-30 MED ORDER — ATORVASTATIN CALCIUM 10 MG PO TABS: 10.0000 mg | ORAL_TABLET | Freq: Every day | ORAL | 3 refills | Status: DC

## 2021-11-04 ENCOUNTER — Other Ambulatory Visit: Payer: Self-pay | Admitting: Physician Assistant

## 2021-11-04 DIAGNOSIS — I10 Essential (primary) hypertension: Secondary | ICD-10-CM

## 2021-11-04 MED ORDER — LISINOPRIL-HYDROCHLOROTHIAZIDE 20-12.5 MG PO TABS: 1.0000 | ORAL_TABLET | Freq: Every day | ORAL | 0 refills | Status: DC

## 2021-11-21 ENCOUNTER — Ambulatory Visit: Payer: No Typology Code available for payment source

## 2021-12-25 ENCOUNTER — Other Ambulatory Visit: Payer: Self-pay | Admitting: Physician Assistant

## 2021-12-25 DIAGNOSIS — F419 Anxiety disorder, unspecified: Secondary | ICD-10-CM

## 2022-01-20 ENCOUNTER — Ambulatory Visit: Payer: No Typology Code available for payment source | Admitting: Physician Assistant

## 2022-01-21 ENCOUNTER — Encounter: Payer: Self-pay | Admitting: Physician Assistant

## 2022-01-21 ENCOUNTER — Other Ambulatory Visit: Payer: Self-pay

## 2022-01-21 ENCOUNTER — Ambulatory Visit: Payer: No Typology Code available for payment source | Admitting: Physician Assistant

## 2022-01-21 VITALS — BP 108/78 | HR 80 | Temp 97.1°F | Resp 18 | Ht 64.0 in | Wt 249.0 lb

## 2022-01-21 DIAGNOSIS — F419 Anxiety disorder, unspecified: Secondary | ICD-10-CM | POA: Diagnosis not present

## 2022-01-21 DIAGNOSIS — I10 Essential (primary) hypertension: Secondary | ICD-10-CM

## 2022-01-21 DIAGNOSIS — E559 Vitamin D deficiency, unspecified: Secondary | ICD-10-CM

## 2022-01-21 DIAGNOSIS — J3089 Other allergic rhinitis: Secondary | ICD-10-CM

## 2022-01-21 DIAGNOSIS — E782 Mixed hyperlipidemia: Secondary | ICD-10-CM | POA: Diagnosis not present

## 2022-01-21 MED ORDER — CITALOPRAM HYDROBROMIDE 20 MG PO TABS: 20.0000 mg | ORAL_TABLET | Freq: Every day | ORAL | 5 refills | Status: DC

## 2022-01-21 MED ORDER — TRIAMCINOLONE ACETONIDE 40 MG/ML IJ SUSP
60.0000 mg | Freq: Once | INTRAMUSCULAR | Status: AC
  Administered 2022-01-21: 60 mg via INTRAMUSCULAR

## 2022-01-21 MED ORDER — ATORVASTATIN CALCIUM 10 MG PO TABS: 10.0000 mg | ORAL_TABLET | Freq: Every day | ORAL | 5 refills | Status: DC

## 2022-01-21 MED ORDER — LISINOPRIL-HYDROCHLOROTHIAZIDE 20-12.5 MG PO TABS: 1.0000 | ORAL_TABLET | Freq: Every day | ORAL | 5 refills | Status: DC

## 2022-01-21 NOTE — Progress Notes (Signed)
? ?Subjective:  ?Patient ID: Allison Baldwin, female    DOB: [birth year]  Age: 46 y.o. MRN: [id number] ? ?Chief Complaint  ?Patient presents with  ? Hypertension  ? ? ?HPI ? Pt presents for follow up of hypertension. The patient is tolerating the medication well without side effects. Compliance with treatment has been good; including taking medication as directed , maintains a healthy diet and regular exercise regimen , and following up as directed. Currently taking zestoretic 20/12.5mg  qd ? ?Mixed hyperlipidemia  ?Pt presents with hyperlipidemia.  The patient is compliant with medications, maintains a low cholesterol diet , follows up as directed , and maintains an exercise regimen . The patient denies experiencing any hypercholesterolemia related symptoms. Currently on lipitor 10mg  qd ? ?Pt with history of anxiety - currently stable on celexa 20mg  qd -  ? ?Pt with complaints of allergies - she is taking allegra but requests kenalog shot today as well ?No current outpatient medications on file prior to visit.  ? ?No current facility-administered medications on file prior to visit.  ? ?Past Medical History:  ?Diagnosis Date  ? Depression   ? Hyperlipidemia   ? Hypertension   ? Vitamin D deficiency   ? ?Past Surgical History:  ?Procedure Laterality Date  ? BACK SURGERY  2008  ? BREAST REDUCTION SURGERY Bilateral 02/20/2020  ? Procedure: BILATERAL BREAST REDUCTION;  Surgeon: Glenna Fellowshimmappa, Brinda, MD;  Location: [facility];  Service: Plastics;  Laterality: Bilateral;  ?  ?Family History  ?Problem Relation Age of Onset  ? Hypothyroidism Mother   ? Congestive Heart Failure Father   ? Breast cancer Paternal Grandmother   ? ?Social History  ? ?Socioeconomic History  ? Marital status: Single  ?  Spouse name: Not on file  ? Number of children: Not on file  ? Years of education: Not on file  ? Highest education level: Not on file  ?Occupational History  ? Not on file  ?Tobacco Use  ? Smoking status: Never  ?  Smokeless tobacco: Never  ?Substance and Sexual Activity  ? Alcohol use: Never  ? Drug use: Never  ? Sexual activity: Not on file  ?Other Topics Concern  ? Not on file  ?Social History Narrative  ? Not on file  ? ?Social Determinants of Health  ? ?Financial Resource Strain: Not on file  ?Food Insecurity: Not on file  ?Transportation Needs: Not on file  ?Physical Activity: Not on file  ?Stress: Not on file  ?Social Connections: Not on file  ? ? ?Review of Systems ?CONSTITUTIONAL: Negative for chills, fatigue, fever, unintentional weight gain and unintentional weight loss.  ?E/N/T: see HPI ?CARDIOVASCULAR: Negative for chest pain, dizziness, palpitations and pedal edema.  ?RESPIRATORY: Negative for recent cough and dyspnea.  ?GASTROINTESTINAL: Negative for abdominal pain, acid reflux symptoms, constipation, diarrhea, nausea and vomiting.  ?MSK: Negative for arthralgias and myalgias.  ?INTEGUMENTARY: Negative for rash.  ?NEUROLOGICAL: Negative for dizziness and headaches.  ?PSYCHIATRIC: Negative for sleep disturbance and to question depression screen.  Negative for depression, negative for anhedonia.  ?   ? ? ?Objective:  ?BP 108/78   Pulse 80   Temp (!) 97.1 ?F (36.2 ?C)   Resp 18   Ht 5\' 4"  (1.626 m)   Wt 249 lb (112.9 kg)   BMI 42.74 kg/m?  ? ? ?  01/21/2022  ?  9:46 AM 10/21/2021  ?  9:47 AM 10/21/2021  ?  9:26 AM  ?BP/Weight  ?Systolic BP 108 140 150  ?  Diastolic BP 78 98 98  ?Wt. (Lbs) 249  251.8  ?BMI 42.74 kg/m2  43.22 kg/m2  ? ? ?Physical Exam ?PHYSICAL EXAM:  ? ?VS: BP 108/78   Pulse 80   Temp (!) 97.1 ?F (36.2 ?C)   Resp 18   Ht 5\' 4"  (1.626 m)   Wt 249 lb (112.9 kg)   BMI 42.74 kg/m?  ? ?GEN: Well nourished, well developed, in no acute distress  ?HEENT: normal external ears and nose - normal external auditory canals and TMS - - Lips, Teeth and Gums - normal  ?Oropharynx -erythema/pnd ?Cardiac: RRR; no murmurs, rubs, or gallops,no edema -  ?Respiratory:  normal respiratory rate and pattern with no  distress - normal breath sounds with no rales, rhonchi, wheezes or rubs ?Skin: warm and dry, no rash  ?Psych: euthymic mood, appropriate affect and demeanor ? ?Diabetic Foot Exam - Simple   ?No data filed ?  ?  ? ?Lab Results  ?Component Value Date  ? WBC 8.2 07/22/2021  ? HGB 14.2 07/22/2021  ? HCT 42.3 07/22/2021  ? PLT 297 07/22/2021  ? GLUCOSE 90 10/21/2021  ? CHOL 234 (H) 10/21/2021  ? TRIG 147 10/21/2021  ? HDL 36 (L) 10/21/2021  ? LDLCALC 171 (H) 10/21/2021  ? ALT 12 10/21/2021  ? AST 12 10/21/2021  ? NA 141 10/21/2021  ? K 4.1 10/21/2021  ? CL 106 10/21/2021  ? CREATININE 0.77 10/21/2021  ? BUN 11 10/21/2021  ? CO2 24 10/21/2021  ? TSH 2.040 07/22/2021  ? ? ? ? ?Assessment & Plan:  ? ?Problem List Items Addressed This Visit   ? ?  ? Cardiovascular and Mediastinum  ? Benign hypertension  ? Relevant Medications  ? atorvastatin (LIPITOR) 10 MG tablet  ? lisinopril-hydrochlorothiazide (ZESTORETIC) 20-12.5 MG tablet  ? Other Relevant Orders  ? CBC with Differential/Platelet  ? Comprehensive metabolic panel  ? TSH  ?  ? Respiratory  ? Allergic rhinitis due to allergen - Primary ?Continue allegra ?Kenalog 60mg  given IM  ?  ? Other  ? Anxiety  ? Relevant Medications  ? citalopram (CELEXA) 20 MG tablet  ? Mixed hyperlipidemia  ? Relevant Medications  ? atorvastatin (LIPITOR) 10 MG tablet  ? lisinopril-hydrochlorothiazide (ZESTORETIC) 20-12.5 MG tablet  ? Other Relevant Orders  ? Lipid panel  ? ?Other Visit Diagnoses   ? ? Vitamin D deficiency      ? Relevant Orders  ? VITAMIN D 25 Hydroxy (Vit-D Deficiency, Fractures)  ? ?  ?. ? ?Meds ordered this encounter  ?Medications  ? triamcinolone acetonide (KENALOG-40) injection 60 mg  ? atorvastatin (LIPITOR) 10 MG tablet  ?  Sig: Take 1 tablet (10 mg total) by mouth daily.  ?  Dispense:  30 tablet  ?  Refill:  5  ?  Order Specific Question:   Supervising Provider  ?  Answer09/22/2022  ? citalopram (CELEXA) 20 MG tablet  ?  Sig: Take 1 tablet (20 mg total) by  mouth daily.  ?  Dispense:  30 tablet  ?  Refill:  5  ?  Order Specific Question:   Supervising Provider  ?  AnswerBlane Ohara [[id number]]  ? lisinopril-hydrochlorothiazide (ZESTORETIC) 20-12.5 MG tablet  ?  Sig: Take 1 tablet by mouth daily.  ?  Dispense:  30 tablet  ?  Refill:  5  ?  Order Specific Question:   Supervising Provider  ?  AnswerBlane Ohara [[id number]]  ? ? ?  Orders Placed This Encounter  ?Procedures  ? CBC with Differential/Platelet  ? Comprehensive metabolic panel  ? TSH  ? Lipid panel  ? VITAMIN D 25 Hydroxy (Vit-D Deficiency, Fractures)  ?  ? ?Follow-up: Return in about 6 months (around 07/24/2022) for fasting physical with pap - please block. ? ?An After Visit Summary was printed and given to the patient. ? ?SARA R Masey Scheiber, PA-C ?[facility] ?[phone]

## 2022-01-22 ENCOUNTER — Other Ambulatory Visit: Payer: Self-pay | Admitting: Physician Assistant

## 2022-01-22 DIAGNOSIS — E559 Vitamin D deficiency, unspecified: Secondary | ICD-10-CM

## 2022-01-22 LAB — COMPREHENSIVE METABOLIC PANEL
ALT: 11 IU/L (ref 0–32)
AST: 16 IU/L (ref 0–40)
Albumin/Globulin Ratio: 1.9 (ref 1.2–2.2)
Albumin: 4.5 g/dL (ref 3.8–4.8)
Alkaline Phosphatase: 101 IU/L (ref 44–121)
BUN/Creatinine Ratio: 14 (ref 9–23)
BUN: 11 mg/dL (ref 6–24)
Bilirubin Total: 0.4 mg/dL (ref 0.0–1.2)
CO2: 24 mmol/L (ref 20–29)
Calcium: 9.3 mg/dL (ref 8.7–10.2)
Chloride: 107 mmol/L — ABNORMAL HIGH (ref 96–106)
Creatinine, Ser: 0.78 mg/dL (ref 0.57–1.00)
Globulin, Total: 2.4 g/dL (ref 1.5–4.5)
Glucose: 89 mg/dL (ref 70–99)
Potassium: 4.5 mmol/L (ref 3.5–5.2)
Sodium: 144 mmol/L (ref 134–144)
Total Protein: 6.9 g/dL (ref 6.0–8.5)
eGFR: 96 mL/min/{1.73_m2} (ref >59)

## 2022-01-22 LAB — LIPID PANEL
Chol/HDL Ratio: 3.6 ratio (ref 0.0–4.4)
Cholesterol, Total: 172 mg/dL (ref 100–199)
HDL: 48 mg/dL (ref >39)
LDL Chol Calc (NIH): 108 mg/dL — ABNORMAL HIGH (ref 0–99)
Triglycerides: 83 mg/dL (ref 0–149)
VLDL Cholesterol Cal: 16 mg/dL (ref 5–40)

## 2022-01-22 LAB — CBC WITH DIFFERENTIAL/PLATELET
Basophils Absolute: 0 10*3/uL (ref 0.0–0.2)
Basos: 1 %
EOS (ABSOLUTE): 0.8 10*3/uL — ABNORMAL HIGH (ref 0.0–0.4)
Eos: 10 %
Hematocrit: 42.2 % (ref 34.0–46.6)
Hemoglobin: 14.3 g/dL (ref 11.1–15.9)
Immature Grans (Abs): 0 10*3/uL (ref 0.0–0.1)
Immature Granulocytes: 0 %
Lymphocytes Absolute: 1.7 10*3/uL (ref 0.7–3.1)
Lymphs: 20 %
MCH: 28.7 pg (ref 26.6–33.0)
MCHC: 33.9 g/dL (ref 31.5–35.7)
MCV: 85 fL (ref 79–97)
Monocytes Absolute: 0.4 10*3/uL (ref 0.1–0.9)
Monocytes: 5 %
Neutrophils Absolute: 5.6 10*3/uL (ref 1.4–7.0)
Neutrophils: 64 %
Platelets: 253 10*3/uL (ref 150–450)
RBC: 4.98 x10E6/uL (ref 3.77–5.28)
RDW: 12.9 % (ref 11.7–15.4)
WBC: 8.6 10*3/uL (ref 3.4–10.8)

## 2022-01-22 LAB — TSH: TSH: 1.22 u[IU]/mL (ref 0.450–4.500)

## 2022-01-22 LAB — CARDIOVASCULAR RISK ASSESSMENT

## 2022-01-22 LAB — VITAMIN D 25 HYDROXY (VIT D DEFICIENCY, FRACTURES): Vit D, 25-Hydroxy: 14.2 ng/mL — ABNORMAL LOW (ref 30.0–100.0)

## 2022-01-22 MED ORDER — VITAMIN D (ERGOCALCIFEROL) 1.25 MG (50000 UNIT) PO CAPS: 50000.0000 [IU] | ORAL_CAPSULE | ORAL | 1 refills | Status: DC

## 2022-04-12 NOTE — Progress Notes (Signed)
No show

## 2022-05-07 ENCOUNTER — Telehealth: Payer: Self-pay

## 2022-05-07 NOTE — Telephone Encounter (Signed)
Patient received Kenalog 60 mg on 01/21/2022. She asked  if can she receive another kenalog shot because her allergies are bothering her. Please advice.

## 2022-05-07 NOTE — Telephone Encounter (Signed)
I left detailed message on voicemail to call us back. 

## 2022-05-08 NOTE — Telephone Encounter (Signed)
I left message on voicemail to call us back. 

## 2022-06-18 ENCOUNTER — Ambulatory Visit: Payer: No Typology Code available for payment source | Admitting: Physician Assistant

## 2022-06-18 ENCOUNTER — Encounter: Payer: Self-pay | Admitting: Physician Assistant

## 2022-06-18 VITALS — BP 110/86 | HR 64 | Temp 98.2°F | Resp 15 | Ht 64.0 in | Wt 246.0 lb

## 2022-06-18 DIAGNOSIS — H6982 Other specified disorders of Eustachian tube, left ear: Secondary | ICD-10-CM | POA: Diagnosis not present

## 2022-06-18 MED ORDER — PREDNISONE 20 MG PO TABS: ORAL_TABLET | ORAL | 0 refills | Status: AC

## 2022-06-18 MED ORDER — FLUTICASONE PROPIONATE 50 MCG/ACT NA SUSP: 2.0000 | Freq: Every day | NASAL | 2 refills | Status: DC

## 2022-06-18 NOTE — Progress Notes (Signed)
Acute Office Visit  Subjective:    Patient ID: Allison Baldwin, female    DOB: 1976-02-08, 46 y.o.   MRN: [id number]  Chief Complaint  Patient presents with   Ear Pain    Left    HPI: Patient is in today for complaint of left ear pain for the past 2 weeks - she states she was seen at [facility] and given antibiotics and 5 days of prednisone over a week ago.  Left ear still feels like it is stopped up.  Denies fever, cough, - has had mild congestion  Past Medical History:  Diagnosis Date   Depression    Hyperlipidemia    Hypertension    Vitamin D deficiency     Past Surgical History:  Procedure Laterality Date   BACK SURGERY  2008   BREAST REDUCTION SURGERY Bilateral 02/20/2020   Procedure: BILATERAL BREAST REDUCTION;  Surgeon: Irene Limbo, MD;  Location: [facility];  Service: Plastics;  Laterality: Bilateral;    Family History  Problem Relation Age of Onset   Hypothyroidism Mother    Congestive Heart Failure Father    Breast cancer Paternal Grandmother     Social History   Socioeconomic History   Marital status: Single    Spouse name: Not on file   Number of children: Not on file   Years of education: Not on file   Highest education level: Not on file  Occupational History   Not on file  Tobacco Use   Smoking status: Never   Smokeless tobacco: Never  Substance and Sexual Activity   Alcohol use: Never   Drug use: Never   Sexual activity: Not on file  Other Topics Concern   Not on file  Social History Narrative   Not on file   Social Determinants of Health   Financial Resource Strain: Not on file  Food Insecurity: Not on file  Transportation Needs: Not on file  Physical Activity: Not on file  Stress: Not on file  Social Connections: Not on file  Intimate Partner Violence: Not on file    Outpatient Medications Prior to Visit  Medication Sig Dispense Refill   atorvastatin (LIPITOR) 10 MG tablet Take 1 tablet (10 mg total) by  mouth daily. 30 tablet 5   azelastine (OPTIVAR) 0.05 % ophthalmic solution Apply 1 drop to eye 2 (two) times daily.     citalopram (CELEXA) 20 MG tablet Take 1 tablet (20 mg total) by mouth daily. 30 tablet 5   lisinopril-hydrochlorothiazide (ZESTORETIC) 20-12.5 MG tablet Take 1 tablet by mouth daily. 30 tablet 5   montelukast (SINGULAIR) 10 MG tablet Take 10 mg by mouth at bedtime.     Vitamin D, Ergocalciferol, (DRISDOL) 1.25 MG (50000 UNIT) CAPS capsule Take 1 capsule (50,000 Units total) by mouth every 7 (seven) days. 12 capsule 1   fluticasone (FLONASE) 50 MCG/ACT nasal spray Place into both nostrils daily.     No facility-administered medications prior to visit.    No Known Allergies  Review of Systems CONSTITUTIONAL: Negative for chills, fatigue, fever, E/N/T: see HPI CARDIOVASCULAR: Negative for chest pain, dizziness RESPIRATORY: Negative for recent cough and dyspnea.          Objective:   PHYSICAL EXAM:   VS: BP 110/86   Pulse 64   Temp 98.2 F (36.8 C)   Resp 15   Ht $R'5\' 4"'XP$  (1.626 m)   Wt 246 lb (111.6 kg)   SpO2 96%   BMI 42.23 kg/m  GEN: Well nourished, well developed, in no acute distress  HEENT: right TM normal - left TM retracted - nares normal- Lips, Teeth and Gums - normal  Oropharynx - normal mucosa, palate, and posterior pharynx  Cardiac: RRR; no murmurs, rubs,  Respiratory:  normal respiratory rate and pattern with no distress - normal breath sounds with no rales, rhonchi, wheezes or rubs     There are no preventive care reminders to display for this patient.   Lab Results  Component Value Date   TSH 1.220 01/21/2022   Lab Results  Component Value Date   WBC 8.6 01/21/2022   HGB 14.3 01/21/2022   HCT 42.2 01/21/2022   MCV 85 01/21/2022   PLT 253 01/21/2022   Lab Results  Component Value Date   NA 144 01/21/2022   K 4.5 01/21/2022   CO2 24 01/21/2022   GLUCOSE 89 01/21/2022   BUN 11 01/21/2022   CREATININE 0.78 01/21/2022    BILITOT 0.4 01/21/2022   ALKPHOS 101 01/21/2022   AST 16 01/21/2022   ALT 11 01/21/2022   PROT 6.9 01/21/2022   ALBUMIN 4.5 01/21/2022   CALCIUM 9.3 01/21/2022   ANIONGAP 9 02/14/2020   EGFR 96 01/21/2022   Lab Results  Component Value Date   CHOL 172 01/21/2022   Lab Results  Component Value Date   HDL 48 01/21/2022   Lab Results  Component Value Date   LDLCALC 108 (H) 01/21/2022   Lab Results  Component Value Date   TRIG 83 01/21/2022   Lab Results  Component Value Date   CHOLHDL 3.6 01/21/2022   No results found for: "HGBA1C"     Assessment & Plan:   Problem List Items Addressed This Visit   None Visit Diagnoses     Dysfunction of left eustachian tube    -  Primary   Relevant Medications   predniSONE (DELTASONE) 20 MG tablet   fluticasone (FLONASE) 50 MCG/ACT nasal spray      Meds ordered this encounter  Medications   predniSONE (DELTASONE) 20 MG tablet    Sig: Take 3 tablets (60 mg total) by mouth daily with breakfast for 3 days, THEN 2 tablets (40 mg total) daily with breakfast for 3 days, THEN 1 tablet (20 mg total) daily with breakfast for 3 days.    Dispense:  18 tablet    Refill:  0    Order Specific Question:   Supervising Provider    Answer:   COX, Lynder Parents   fluticasone (FLONASE) 50 MCG/ACT nasal spray    Sig: Place 2 sprays into both nostrils daily.    Dispense:  9.9 mL    Refill:  2    Order Specific Question:   Supervising Provider    Answer:   Shelton Silvas    No orders of the defined types were placed in this encounter.    Follow-up: Return if symptoms worsen or fail to improve. Recommend salt water gargles bid An After Visit Summary was printed and given to the patient.  Yetta Flock [facility] [phone]

## 2022-07-29 ENCOUNTER — Encounter: Payer: Self-pay | Admitting: Physician Assistant

## 2022-07-29 ENCOUNTER — Ambulatory Visit (INDEPENDENT_AMBULATORY_CARE_PROVIDER_SITE_OTHER): Payer: Commercial Managed Care - HMO | Admitting: Physician Assistant

## 2022-07-29 VITALS — BP 118/86 | HR 74 | Temp 97.1°F | Ht 63.5 in | Wt 251.0 lb

## 2022-07-29 DIAGNOSIS — Z0000 Encounter for general adult medical examination without abnormal findings: Secondary | ICD-10-CM | POA: Diagnosis not present

## 2022-07-29 DIAGNOSIS — F419 Anxiety disorder, unspecified: Secondary | ICD-10-CM

## 2022-07-29 DIAGNOSIS — E559 Vitamin D deficiency, unspecified: Secondary | ICD-10-CM

## 2022-07-29 DIAGNOSIS — I10 Essential (primary) hypertension: Secondary | ICD-10-CM | POA: Diagnosis not present

## 2022-07-29 DIAGNOSIS — Z1231 Encounter for screening mammogram for malignant neoplasm of breast: Secondary | ICD-10-CM

## 2022-07-29 LAB — POCT URINALYSIS DIP (CLINITEK)
Bilirubin, UA: NEGATIVE
Blood, UA: NEGATIVE
Glucose, UA: NEGATIVE mg/dL
Ketones, POC UA: NEGATIVE mg/dL
Leukocytes, UA: NEGATIVE
Nitrite, UA: NEGATIVE
POC PROTEIN,UA: NEGATIVE
Spec Grav, UA: 1.01 (ref 1.010–1.025)
Urobilinogen, UA: 0.2 E.U./dL
pH, UA: 5 (ref 5.0–8.0)

## 2022-07-29 MED ORDER — LISINOPRIL-HYDROCHLOROTHIAZIDE 20-12.5 MG PO TABS: 1.0000 | ORAL_TABLET | Freq: Every day | ORAL | 5 refills | Status: DC

## 2022-07-29 MED ORDER — CITALOPRAM HYDROBROMIDE 20 MG PO TABS: 20.0000 mg | ORAL_TABLET | Freq: Every day | ORAL | 5 refills | Status: DC

## 2022-07-29 NOTE — Progress Notes (Signed)
Subjective:  Patient ID: Allison Baldwin, female    DOB: Sep 02, 1976  Age: 46 y.o. MRN: [id number]  Chief Complaint  Patient presents with   Annual Exam    HPI Well Adult Physical: Patient here for a comprehensive physical exam.The patient reports no problems Do you take any herbs or supplements that were not prescribed by a doctor? no Are you taking calcium supplements? no Are you taking aspirin daily? no  Encounter for general adult medical examination without abnormal findings  Physical ("At Risk" items are starred): Patient's last physical exam was 1 year ago .  Patient is not afflicted from Stress Incontinence and Urge Incontinence  Patient wears a seat belt, has smoke detectors, has carbon monoxide detectors, practices appropriate gun safety, and wears sunscreen with extended sun exposure. Dental Care: biannual cleanings, brushes and flosses daily. Ophthalmology/Optometry: Annual visit.  Hearing loss: none Vision impairments: wears glasses  History of abnormal menses - not sexually active Safe at home: yes Self breast exams: no - is due for mammogram  Flowsheet Row Office Visit from 07/29/2022 in [facility]  PHQ-2 Total Score 1               Social Hx   Social History   Socioeconomic History   Marital status: Single    Spouse name: Not on file   Number of children: Not on file   Years of education: Not on file   Highest education level: Not on file  Occupational History   Not on file  Tobacco Use   Smoking status: Never   Smokeless tobacco: Never  Substance and Sexual Activity   Alcohol use: Never   Drug use: Never   Sexual activity: Not on file  Other Topics Concern   Not on file  Social History Narrative   Not on file   Social Determinants of Health   Financial Resource Strain: Not on file  Food Insecurity: Not on file  Transportation Needs: Not on file  Physical Activity: Not on file  Stress: Not on file  Social Connections: Not on  file   Past Medical History:  Diagnosis Date   Depression    Hyperlipidemia    Hypertension    Vitamin D deficiency    Past Surgical History:  Procedure Laterality Date   BACK SURGERY  2008   BREAST REDUCTION SURGERY Bilateral 02/20/2020   Procedure: BILATERAL BREAST REDUCTION;  Surgeon: Glenna Fellows, MD;  Location: [facility];  Service: Plastics;  Laterality: Bilateral;    Family History  Problem Relation Age of Onset   Hypothyroidism Mother    Congestive Heart Failure Father    Breast cancer Paternal Grandmother     Review of Systems  CONSTITUTIONAL: Negative for chills, fatigue, fever, unintentional weight gain and unintentional weight loss.  E/N/T: Negative for ear pain, nasal congestion and sore throat.  CARDIOVASCULAR: Negative for chest pain, dizziness, palpitations and pedal edema.  RESPIRATORY: Negative for recent cough and dyspnea.  GASTROINTESTINAL: Negative for abdominal pain, acid reflux symptoms, constipation, diarrhea, nausea and vomiting.  MSK: Negative for arthralgias and myalgias.  INTEGUMENTARY: Negative for rash.  NEUROLOGICAL: Negative for dizziness and headaches.  PSYCHIATRIC: Negative for sleep disturbance and to question depression screen.  Negative for depression, negative for anhedonia.      Objective:  PHYSICAL EXAM:   VS: BP 118/86 (BP Location: Left Arm, Patient Position: Sitting, Cuff Size: Large)   Pulse 74   Temp (!) 97.1 F (36.2 C) (Temporal)  Ht 5' 3.5" (1.613 m)   Wt 251 lb (113.9 kg)   SpO2 98%   BMI 43.77 kg/m   GEN: Well nourished, well developed, in no acute distress  HEENT: normal external ears and nose - normal external auditory canals and TMS - hearing grossly normal - - Lips, Teeth and Gums - normal  Oropharynx - normal mucosa, palate, and posterior pharynx Neck: no JVD or masses - no thyromegaly Cardiac: RRR; no murmurs,  Respiratory:  normal respiratory rate and pattern with no distress - normal  breath sounds with no rales, rhonchi, wheezes or rubs Breasts -  no masses GI: normal bowel sounds, no masses or tenderness GU - normal external genitalia - cervix pink - bimanual exam negative MS: no deformity or atrophy  Skin: warm and dry, no rash  Neuro:  Alert and Oriented x 3, Strength and sensation are intact - CN II-Xii grossly intact Psych: euthymic mood, appropriate affect and demeanor  Lab Results  Component Value Date   WBC 8.6 01/21/2022   HGB 14.3 01/21/2022   HCT 42.2 01/21/2022   PLT 253 01/21/2022   GLUCOSE 89 01/21/2022   CHOL 172 01/21/2022   TRIG 83 01/21/2022   HDL 48 01/21/2022   LDLCALC 108 (H) 01/21/2022   ALT 11 01/21/2022   AST 16 01/21/2022   NA 144 01/21/2022   K 4.5 01/21/2022   CL 107 (H) 01/21/2022   CREATININE 0.78 01/21/2022   BUN 11 01/21/2022   CO2 24 01/21/2022   TSH 1.220 01/21/2022      Assessment & Plan:   Problem List Items Addressed This Visit       Cardiovascular and Mediastinum   Benign hypertension   Relevant Medications   lisinopril-hydrochlorothiazide (ZESTORETIC) 20-12.5 MG tablet     Other   Anxiety   Relevant Medications   citalopram (CELEXA) 20 MG tablet   Other Visit Diagnoses     Routine physical examination    -  Primary   Relevant Orders   POCT URINALYSIS DIP (CLINITEK)   IGP, Aptima HPV, rfx 16/18,45   CBC with Differential/Platelet   Comprehensive metabolic panel   TSH   Lipid panel   VITAMIN D 25 Hydroxy (Vit-D Deficiency, Fractures)   Flu Vaccine QUAD 6+ mos PF IM (Fluarix Quad PF)   Vitamin D deficiency       Relevant Orders   VITAMIN D 25 Hydroxy (Vit-D Deficiency, Fractures)   Encounter for screening mammogram for breast cancer       Relevant Orders   MM DIGITAL SCREENING BILATERAL         Body mass index is 43.77 kg/m.   These are the goals we discussed:  Goals   None      This is a list of the screening recommended for you and due dates:  Health Maintenance  Topic Date Due    Mammogram  Never done   COVID-19 Vaccine (3 - Moderna series) 01/29/2020   Pap Smear  06/01/2020   Tetanus Vaccine  11/21/2030   HPV Vaccine  Aged Out   Flu Shot  Discontinued   Hepatitis C Screening: USPSTF Recommendation to screen - Ages 18-79 yo.  Discontinued   HIV Screening  Discontinued     Meds ordered this encounter  Medications   lisinopril-hydrochlorothiazide (ZESTORETIC) 20-12.5 MG tablet    Sig: Take 1 tablet by mouth daily.    Dispense:  30 tablet    Refill:  5    Order Specific Question:  Supervising Provider    Answer:   Blane Ohara [[id number]]   citalopram (CELEXA) 20 MG tablet    Sig: Take 1 tablet (20 mg total) by mouth daily.    Dispense:  30 tablet    Refill:  5    Order Specific Question:   Supervising Provider    AnswerCorey Harold    Follow-up: Return in about 6 months (around 01/27/2023) for chronic fasting follow up.  An After Visit Summary was printed and given to the patient.  Jettie Pagan [facility] [phone]

## 2022-07-30 LAB — COMPREHENSIVE METABOLIC PANEL
ALT: 12 IU/L (ref 0–32)
AST: 16 IU/L (ref 0–40)
Albumin/Globulin Ratio: 1.8 (ref 1.2–2.2)
Albumin: 4.2 g/dL (ref 3.9–4.9)
Alkaline Phosphatase: 98 IU/L (ref 44–121)
BUN/Creatinine Ratio: 12 (ref 9–23)
BUN: 10 mg/dL (ref 6–24)
Bilirubin Total: 0.3 mg/dL (ref 0.0–1.2)
CO2: 23 mmol/L (ref 20–29)
Calcium: 9.1 mg/dL (ref 8.7–10.2)
Chloride: 99 mmol/L (ref 96–106)
Creatinine, Ser: 0.85 mg/dL (ref 0.57–1.00)
Globulin, Total: 2.4 g/dL (ref 1.5–4.5)
Glucose: 81 mg/dL (ref 70–99)
Potassium: 4.2 mmol/L (ref 3.5–5.2)
Sodium: 137 mmol/L (ref 134–144)
Total Protein: 6.6 g/dL (ref 6.0–8.5)
eGFR: 87 mL/min/{1.73_m2} (ref >59)

## 2022-07-30 LAB — CBC WITH DIFFERENTIAL/PLATELET
Basophils Absolute: 0 10*3/uL (ref 0.0–0.2)
Basos: 0 %
EOS (ABSOLUTE): 0.2 10*3/uL (ref 0.0–0.4)
Eos: 2 %
Hematocrit: 41.4 % (ref 34.0–46.6)
Hemoglobin: 13.2 g/dL (ref 11.1–15.9)
Immature Grans (Abs): 0 10*3/uL (ref 0.0–0.1)
Immature Granulocytes: 0 %
Lymphocytes Absolute: 1.7 10*3/uL (ref 0.7–3.1)
Lymphs: 22 %
MCH: 28.1 pg (ref 26.6–33.0)
MCHC: 31.9 g/dL (ref 31.5–35.7)
MCV: 88 fL (ref 79–97)
Monocytes Absolute: 0.4 10*3/uL (ref 0.1–0.9)
Monocytes: 5 %
Neutrophils Absolute: 5.4 10*3/uL (ref 1.4–7.0)
Neutrophils: 71 %
Platelets: 255 10*3/uL (ref 150–450)
RBC: 4.7 x10E6/uL (ref 3.77–5.28)
RDW: 12.7 % (ref 11.7–15.4)
WBC: 7.7 10*3/uL (ref 3.4–10.8)

## 2022-07-30 LAB — LIPID PANEL
Chol/HDL Ratio: 3.5 ratio (ref 0.0–4.4)
Cholesterol, Total: 161 mg/dL (ref 100–199)
HDL: 46 mg/dL (ref >39)
LDL Chol Calc (NIH): 99 mg/dL (ref 0–99)
Triglycerides: 86 mg/dL (ref 0–149)
VLDL Cholesterol Cal: 16 mg/dL (ref 5–40)

## 2022-07-30 LAB — CARDIOVASCULAR RISK ASSESSMENT

## 2022-07-30 LAB — TSH: TSH: 1.32 u[IU]/mL (ref 0.450–4.500)

## 2022-07-30 LAB — VITAMIN D 25 HYDROXY (VIT D DEFICIENCY, FRACTURES): Vit D, 25-Hydroxy: 38.4 ng/mL (ref 30.0–100.0)

## 2022-07-31 ENCOUNTER — Ambulatory Visit: Payer: Self-pay | Admitting: Internal Medicine

## 2022-07-31 DIAGNOSIS — Z0000 Encounter for general adult medical examination without abnormal findings: Secondary | ICD-10-CM

## 2022-07-31 DIAGNOSIS — Z23 Encounter for immunization: Secondary | ICD-10-CM | POA: Diagnosis not present

## 2022-08-02 LAB — IGP, APTIMA HPV, RFX 16/18,45
HPV Aptima: NEGATIVE
PAP Smear Comment: 0

## 2022-08-26 ENCOUNTER — Ambulatory Visit
Admission: RE | Admit: 2022-08-26 | Discharge: 2022-08-26 | Disposition: A | Discharge status: Home / Self Care | Payer: Commercial Managed Care - HMO | Source: Ambulatory Visit | Attending: Physician Assistant | Admitting: Physician Assistant

## 2022-08-26 DIAGNOSIS — Z1231 Encounter for screening mammogram for malignant neoplasm of breast: Secondary | ICD-10-CM

## 2023-02-02 ENCOUNTER — Ambulatory Visit: Payer: Commercial Managed Care - HMO | Admitting: Physician Assistant

## 2023-02-03 ENCOUNTER — Other Ambulatory Visit: Payer: Self-pay | Admitting: Physician Assistant

## 2023-02-03 DIAGNOSIS — F419 Anxiety disorder, unspecified: Secondary | ICD-10-CM

## 2023-02-03 DIAGNOSIS — I10 Essential (primary) hypertension: Secondary | ICD-10-CM

## 2023-02-23 ENCOUNTER — Encounter: Payer: Self-pay | Admitting: Physician Assistant

## 2023-02-23 ENCOUNTER — Ambulatory Visit: Payer: Managed Care, Other (non HMO) | Admitting: Physician Assistant

## 2023-02-23 VITALS — BP 120/86 | HR 77 | Temp 97.1°F | Ht 63.5 in | Wt 254.2 lb

## 2023-02-23 DIAGNOSIS — E559 Vitamin D deficiency, unspecified: Secondary | ICD-10-CM | POA: Diagnosis not present

## 2023-02-23 DIAGNOSIS — R5383 Other fatigue: Secondary | ICD-10-CM | POA: Diagnosis not present

## 2023-02-23 DIAGNOSIS — E782 Mixed hyperlipidemia: Secondary | ICD-10-CM

## 2023-02-23 DIAGNOSIS — I10 Essential (primary) hypertension: Secondary | ICD-10-CM | POA: Diagnosis not present

## 2023-02-23 DIAGNOSIS — F418 Other specified anxiety disorders: Secondary | ICD-10-CM

## 2023-02-23 MED ORDER — CITALOPRAM HYDROBROMIDE 40 MG PO TABS: 40.0000 mg | ORAL_TABLET | Freq: Every day | ORAL | 3 refills | Status: DC

## 2023-02-23 NOTE — Progress Notes (Signed)
Subjective:  Patient ID: Allison Baldwin, female    DOB: 03-Mar-1976  Age: 47 y.o. MRN: [id number]  Chief Complaint  Patient presents with   Hypertension    Hypertension    Pt presents for follow up of hypertension. The patient is tolerating the medication well without side effects. Compliance with treatment has been good; including taking medication as directed , , and following up as directed. Currently taking zestoretic 20/12.5mg  qd  Mixed hyperlipidemia  Pt presents with hyperlipidemia.  The patient is compliant with medications, maintains a low cholesterol diet , follows up as directed ,  The patient denies experiencing any hypercholesterolemia related symptoms. Currently on lipitor  qd  Pt with history of anxiety with depression - she states over the past few months she has noted about a week out of month she seems more down and disinterested - denies suicidal ideations - she is currently taking celexa  qd but feels she could benefit from increased dose She is not interested in talking with counselor  Pt with complaints of allergies - she takes flonase and singulair which control her symptoms Current Outpatient Medications on File Prior to Visit  Medication Sig Dispense Refill   atorvastatin (LIPITOR) 10 MG tablet Take 1 tablet (10 mg total) by mouth daily. 30 tablet 5   azelastine (OPTIVAR) 0.05 % ophthalmic solution Apply 1 drop to eye 2 (two) times daily.     fluticasone (FLONASE) 50 MCG/ACT nasal spray Place 2 sprays into both nostrils daily. 9.9 mL 2   lisinopril-hydrochlorothiazide (ZESTORETIC) 20-12.5 MG tablet Take 1 tablet by mouth once daily 30 tablet 0   montelukast (SINGULAIR) 10 MG tablet Take 10 mg by mouth at bedtime.     No current facility-administered medications on file prior to visit.   Past Medical History:  Diagnosis Date   Depression    Hyperlipidemia    Hypertension    Vitamin D deficiency    Past Surgical History:  Procedure Laterality  Date   BACK SURGERY  2008   BREAST REDUCTION SURGERY Bilateral 02/20/2020   Procedure: BILATERAL BREAST REDUCTION;  Surgeon: Glenna Fellows, MD;  Location: [facility];  Service: Plastics;  Laterality: Bilateral;   REDUCTION MAMMAPLASTY      Family History  Problem Relation Age of Onset   Hypothyroidism Mother    Congestive Heart Failure Father    Breast cancer Paternal Grandmother    Social History   Socioeconomic History   Marital status: Single    Spouse name: Not on file   Number of children: Not on file   Years of education: Not on file   Highest education level: Not on file  Occupational History   Not on file  Tobacco Use   Smoking status: Never   Smokeless tobacco: Never  Substance and Sexual Activity   Alcohol use: Never   Drug use: Never   Sexual activity: Not on file  Other Topics Concern   Not on file  Social History Narrative   Not on file   Social Determinants of Health   Financial Resource Strain: Not on file  Food Insecurity: Not on file  Transportation Needs: Not on file  Physical Activity: Not on file  Stress: Not on file  Social Connections: Not on file   CONSTITUTIONAL: see HPI E/N/T: Negative for ear pain, nasal congestion and sore throat.  CARDIOVASCULAR: Negative for chest pain, dizziness, palpitations and pedal edema.  RESPIRATORY: Negative for recent cough and dyspnea.  GASTROINTESTINAL: Negative for  abdominal pain, acid reflux symptoms, constipation, diarrhea, nausea and vomiting.  MSK: Negative for arthralgias and myalgias.  INTEGUMENTARY: Negative for rash.  NEUROLOGICAL: Negative for dizziness and headaches.  PSYCHIATRIC: see HPI      Objective:  PHYSICAL EXAM:   VS: BP 120/86 (BP Location: Left Arm, Patient Position: Sitting, Cuff Size: Large)   Pulse 77   Temp (!) 97.1 F (36.2 C) (Temporal)   Ht 5' 3.5" (1.613 m)   Wt 254 lb 3.2 oz (115.3 kg)   SpO2 99%   BMI 44.32 kg/m   GEN: Well nourished, well  developed, in no acute distress  Cardiac: RRR; no murmurs, rubs, or gallops,no edema -  Respiratory:  normal respiratory rate and pattern with no distress - normal breath sounds with no rales, rhonchi, wheezes or rubs Skin: warm and dry, no rash  Neuro:  Alert and Oriented x 3,  - CN II-Xii grossly intact Psych: euthymic mood, appropriate affect and demeanor   Lab Results  Component Value Date   WBC 7.7 07/29/2022   HGB 13.2 07/29/2022   HCT 41.4 07/29/2022   PLT 255 07/29/2022   GLUCOSE 81 07/29/2022   CHOL 161 07/29/2022   TRIG 86 07/29/2022   HDL 46 07/29/2022   LDLCALC 99 07/29/2022   ALT 12 07/29/2022   AST 16 07/29/2022   NA 137 07/29/2022   K 4.2 07/29/2022   CL 99 07/29/2022   CREATININE 0.85 07/29/2022   BUN 10 07/29/2022   CO2 23 07/29/2022   TSH 1.320 07/29/2022      Assessment & Plan:   Problem List Items Addressed This Visit       Cardiovascular and Mediastinum   Benign hypertension   Relevant Medications   atorvastatin (LIPITOR) 10 MG tablet   lisinopril-hydrochlorothiazide (ZESTORETIC) 20-12.5 MG tablet   Other Relevant Orders   CBC with Differential/Platelet   Comprehensive metabolic panel   TSH Continue med     Respiratory   Allergic rhinitis due to allergen - Primary Continue flonase and singulair     Other   Anxiety   Relevant Medications   Increase celexa to  qd   Mixed hyperlipidemia   Relevant Medications   atorvastatin (LIPITOR) 10 MG tablet   lisinopril-hydrochlorothiazide (ZESTORETIC) 20-12.5 MG tablet   Other Relevant Orders   Lipid panel Watch diet   Other Visit Diagnoses   Fatigue Labwork pending - cbc, cmp, tsh , iron studies  Vitamin D deficiency       Relevant Orders   VITAMIN D 25 Hydroxy (Vit-D Deficiency, Fractures)     .  Meds ordered this encounter  Medications   citalopram (CELEXA) 40 MG tablet    Sig: Take 1 tablet (40 mg total) by mouth daily.    Dispense:  30 tablet    Refill:  3    Order  Specific Question:   Supervising Provider    AnswerCorey Harold    Orders Placed This Encounter  Procedures   CBC with Differential/Platelet   Comprehensive metabolic panel   TSH   Lipid panel   VITAMIN D 25 Hydroxy (Vit-D Deficiency, Fractures)   Iron, TIBC and Ferritin Panel     Follow-up: Return in about 4 weeks (around 03/23/2023) for follow up.  An After Visit Summary was printed and given to the patient.  Jettie Pagan [facility] [phone]

## 2023-02-24 ENCOUNTER — Other Ambulatory Visit: Payer: Self-pay

## 2023-02-24 ENCOUNTER — Other Ambulatory Visit: Payer: Self-pay | Admitting: Physician Assistant

## 2023-02-24 DIAGNOSIS — E559 Vitamin D deficiency, unspecified: Secondary | ICD-10-CM

## 2023-02-24 DIAGNOSIS — E782 Mixed hyperlipidemia: Secondary | ICD-10-CM

## 2023-02-24 LAB — LIPID PANEL
Chol/HDL Ratio: 4.4 ratio (ref 0.0–4.4)
Cholesterol, Total: 243 mg/dL — ABNORMAL HIGH (ref 100–199)
HDL: 55 mg/dL (ref >39)
LDL Chol Calc (NIH): 164 mg/dL — ABNORMAL HIGH (ref 0–99)
Triglycerides: 133 mg/dL (ref 0–149)
VLDL Cholesterol Cal: 24 mg/dL (ref 5–40)

## 2023-02-24 LAB — IRON,TIBC AND FERRITIN PANEL
Ferritin: 28 ng/mL (ref 15–150)
Iron Saturation: 18 % (ref 15–55)
Iron: 78 ug/dL (ref 27–159)
Total Iron Binding Capacity: 422 ug/dL (ref 250–450)
UIBC: 344 ug/dL (ref 131–425)

## 2023-02-24 LAB — CBC WITH DIFFERENTIAL/PLATELET
Basophils Absolute: 0.1 10*3/uL (ref 0.0–0.2)
Basos: 1 %
EOS (ABSOLUTE): 0.3 10*3/uL (ref 0.0–0.4)
Eos: 5 %
Hematocrit: 45.6 % (ref 34.0–46.6)
Hemoglobin: 14.7 g/dL (ref 11.1–15.9)
Immature Grans (Abs): 0 10*3/uL (ref 0.0–0.1)
Immature Granulocytes: 0 %
Lymphocytes Absolute: 1.6 10*3/uL (ref 0.7–3.1)
Lymphs: 24 %
MCH: 28.5 pg (ref 26.6–33.0)
MCHC: 32.2 g/dL (ref 31.5–35.7)
MCV: 89 fL (ref 79–97)
Monocytes Absolute: 0.5 10*3/uL (ref 0.1–0.9)
Monocytes: 7 %
Neutrophils Absolute: 4.5 10*3/uL (ref 1.4–7.0)
Neutrophils: 63 %
Platelets: 298 10*3/uL (ref 150–450)
RBC: 5.15 x10E6/uL (ref 3.77–5.28)
RDW: 13.3 % (ref 11.7–15.4)
WBC: 7 10*3/uL (ref 3.4–10.8)

## 2023-02-24 LAB — COMPREHENSIVE METABOLIC PANEL
ALT: 12 IU/L (ref 0–32)
AST: 16 IU/L (ref 0–40)
Albumin/Globulin Ratio: 1.7 (ref 1.2–2.2)
Albumin: 4.3 g/dL (ref 3.9–4.9)
Alkaline Phosphatase: 97 IU/L (ref 44–121)
BUN/Creatinine Ratio: 11 (ref 9–23)
BUN: 9 mg/dL (ref 6–24)
Bilirubin Total: 0.2 mg/dL (ref 0.0–1.2)
CO2: 24 mmol/L (ref 20–29)
Calcium: 9.8 mg/dL (ref 8.7–10.2)
Chloride: 102 mmol/L (ref 96–106)
Creatinine, Ser: 0.83 mg/dL (ref 0.57–1.00)
Globulin, Total: 2.6 g/dL (ref 1.5–4.5)
Glucose: 79 mg/dL (ref 70–99)
Potassium: 4.4 mmol/L (ref 3.5–5.2)
Sodium: 141 mmol/L (ref 134–144)
Total Protein: 6.9 g/dL (ref 6.0–8.5)
eGFR: 89 mL/min/{1.73_m2} (ref >59)

## 2023-02-24 LAB — TSH: TSH: 2.19 u[IU]/mL (ref 0.450–4.500)

## 2023-02-24 LAB — CARDIOVASCULAR RISK ASSESSMENT

## 2023-02-24 LAB — VITAMIN D 25 HYDROXY (VIT D DEFICIENCY, FRACTURES): Vit D, 25-Hydroxy: 14.7 ng/mL — ABNORMAL LOW (ref 30.0–100.0)

## 2023-02-24 MED ORDER — ATORVASTATIN CALCIUM 10 MG PO TABS: 10.0000 mg | ORAL_TABLET | Freq: Every day | ORAL | 5 refills | Status: DC

## 2023-02-24 MED ORDER — VITAMIN D (ERGOCALCIFEROL) 1.25 MG (50000 UNIT) PO CAPS: 50000.0000 [IU] | ORAL_CAPSULE | ORAL | 5 refills | Status: DC

## 2023-03-08 ENCOUNTER — Other Ambulatory Visit: Payer: Self-pay | Admitting: Physician Assistant

## 2023-03-08 DIAGNOSIS — I10 Essential (primary) hypertension: Secondary | ICD-10-CM

## 2023-03-08 DIAGNOSIS — F419 Anxiety disorder, unspecified: Secondary | ICD-10-CM

## 2023-03-31 ENCOUNTER — Encounter: Payer: Self-pay | Admitting: Physician Assistant

## 2023-03-31 ENCOUNTER — Ambulatory Visit: Payer: Managed Care, Other (non HMO) | Admitting: Physician Assistant

## 2023-03-31 VITALS — BP 130/100 | HR 64 | Temp 97.2°F | Ht 63.5 in | Wt 246.0 lb

## 2023-03-31 DIAGNOSIS — Z1211 Encounter for screening for malignant neoplasm of colon: Secondary | ICD-10-CM | POA: Insufficient documentation

## 2023-03-31 DIAGNOSIS — I10 Essential (primary) hypertension: Secondary | ICD-10-CM | POA: Diagnosis not present

## 2023-03-31 DIAGNOSIS — N760 Acute vaginitis: Secondary | ICD-10-CM

## 2023-03-31 DIAGNOSIS — F418 Other specified anxiety disorders: Secondary | ICD-10-CM | POA: Insufficient documentation

## 2023-03-31 MED ORDER — LISINOPRIL-HYDROCHLOROTHIAZIDE 20-25 MG PO TABS: 1.0000 | ORAL_TABLET | Freq: Every day | ORAL | 1 refills | Status: DC

## 2023-03-31 MED ORDER — CEPHALEXIN 500 MG PO CAPS: 500.0000 mg | ORAL_CAPSULE | Freq: Two times a day (BID) | ORAL | 0 refills | Status: DC

## 2023-03-31 NOTE — Progress Notes (Signed)
Subjective:  Patient ID: Allison Baldwin, female    DOB: 07-23-1976  Age: 47 y.o. MRN: [id number]  Chief Complaint  Patient presents with   Hypertension    1 Month follow up    HPI Pt in today for follow up of hypertension - bp actually elevated again today - she denies chest pain/dyspnea It is noted pt has had elevated bp at last few visits with other providers as well She is currently taking lisinopril 20/12.5mg  qd Pt has been watching diet and decreased carbs/sugars - lost 10 pounds in past month  Pt in for recheck of anxiety - states she is feeling great on the increased dose of celexa 40mg  qd - would like to continue  Pt would like to schedule screening colonoscopy  Pt complains of vaginal 'bump' over the past 2 days - states sore to touch Has drained slightly     03/31/2023    2:11 PM 02/23/2023    2:17 PM 07/29/2022    9:22 AM 07/22/2021    9:01 AM 11/21/2020    3:52 PM  Depression screen PHQ 2/9  Decreased Interest 0 3 0 2 0  Down, Depressed, Hopeless 0 3 1 2 2   PHQ - 2 Score 0 6 1 4 2   Altered sleeping 3 0 3 3 3   Tired, decreased energy 1 3 2 3 3   Change in appetite 0 0 2 3 3   Feeling bad or failure about yourself  0 1 1 0 0  Trouble concentrating 0 1 0 1 0  Moving slowly or fidgety/restless 0 0 0 3 0  Suicidal thoughts 0 0 0 0 0  PHQ-9 Score 4 11 9 17 11   Difficult doing work/chores Not difficult at all Very difficult Not difficult at all Not difficult at all Somewhat difficult        02/20/2020    6:40 AM 02/23/2023    2:19 PM  Fall Risk  Falls in the past year?  0  Was there an injury with Fall?  0  Fall Risk Category Calculator  0  (RETIRED) Patient Fall Risk Level Low fall risk   Patient at Risk for Falls Due to  History of fall(s)  Fall risk Follow up  Falls evaluation completed     ROS CONSTITUTIONAL: Negative for chills, fatigue, fever, unintentional weight gain and unintentional weight loss.  E/N/T: Negative for ear pain, nasal congestion and  sore throat.  CARDIOVASCULAR: Negative for chest pain, dizziness, palpitations and pedal edema.  RESPIRATORY: Negative for recent cough and dyspnea.  GASTROINTESTINAL: Negative for abdominal pain, acid reflux symptoms, constipation, diarrhea, nausea and vomiting.  GU - see HPI MSK: Negative for arthralgias and myalgias.  INTEGUMENTARY: see HPI PSYCHIATRIC: Negative for sleep disturbance and to question depression screen.  Negative for depression, negative for anhedonia.    Current Outpatient Medications:    atorvastatin (LIPITOR) 10 MG tablet, Take 1 tablet (10 mg total) by mouth daily., Disp: 30 tablet, Rfl: 5   azelastine (OPTIVAR) 0.05 % ophthalmic solution, Apply 1 drop to eye 2 (two) times daily., Disp: , Rfl:    cephALEXin (KEFLEX) 500 MG capsule, Take 1 capsule (500 mg total) by mouth 2 (two) times daily., Disp: 20 capsule, Rfl: 0   citalopram (CELEXA) 40 MG tablet, Take 1 tablet (40 mg total) by mouth daily., Disp: 30 tablet, Rfl: 3   fluticasone (FLONASE) 50 MCG/ACT nasal spray, Place 2 sprays into both nostrils daily., Disp: 9.9 mL, Rfl: 2   lisinopril-hydrochlorothiazide (  ZESTORETIC) 20-25 MG tablet, Take 1 tablet by mouth daily., Disp: 30 tablet, Rfl: 1   Magnesium Oxide (MAG-200 PO), Take by mouth., Disp: , Rfl:    Melatonin-Pyridoxine (MELATIN PO), Take by mouth., Disp: , Rfl:    montelukast (SINGULAIR) 10 MG tablet, Take 10 mg by mouth at bedtime., Disp: , Rfl:    Multiple Vitamins-Minerals (SKIN BEAUTY & WELLNESS PO), Take by mouth., Disp: , Rfl:    Vitamin D, Ergocalciferol, (DRISDOL) 1.25 MG (50000 UNIT) CAPS capsule, Take 1 capsule (50,000 Units total) by mouth every 7 (seven) days., Disp: 5 capsule, Rfl: 5  Past Medical History:  Diagnosis Date   Depression    Hyperlipidemia    Hypertension    Vitamin D deficiency    Objective:  PHYSICAL EXAM:   BP (!) 130/100 (BP Location: Left Leg, Patient Position: Sitting, Cuff Size: Large)   Pulse 64   Temp (!) 97.2 F  (36.2 C) (Temporal)   Ht 5' 3.5" (1.613 m)   Wt 246 lb (111.6 kg)   SpO2 97%   BMI 42.89 kg/m    GEN: Well nourished, well developed, in no acute distress  Cardiac: RRR; no murmurs, rubs, or gallops,no edema - Respiratory:  normal respiratory rate and pattern with no distress - normal breath sounds with no rales, rhonchi, wheezes or rubs GI: normal bowel sounds, no masses or tenderness Skin: external genitalia with small open abscess left vaginal wall - no induration Psych: euthymic mood, appropriate affect and demeanor  Assessment & Plan:    Benign hypertension -     Lisinopril-hydroCHLOROthiazide; Take 1 tablet by mouth daily.  Dispense: 30 tablet; Refill: 1 Recheck bp in 3 weeks Anxiety with depression Continue celexa Abscess of tunica vaginalis -     Cephalexin; Take 1 capsule (500 mg total) by mouth 2 (two) times daily.  Dispense: 20 capsule; Refill: 0 Warm compresses Colon cancer screening -     Ambulatory referral to Gastroenterology     Follow-up: Return in about 3 weeks (around 04/21/2023) for nurse visit BP check - 3 months with me fasting follow up.  An After Visit Summary was printed and given to the patient.  Jettie Pagan [facility] [phone]

## 2023-04-09 ENCOUNTER — Other Ambulatory Visit: Payer: Self-pay | Admitting: Physician Assistant

## 2023-04-09 DIAGNOSIS — I10 Essential (primary) hypertension: Secondary | ICD-10-CM

## 2023-04-09 MED ORDER — LISINOPRIL-HYDROCHLOROTHIAZIDE 20-25 MG PO TABS: 1.0000 | ORAL_TABLET | Freq: Every day | ORAL | 1 refills | Status: DC

## 2023-04-21 ENCOUNTER — Ambulatory Visit: Payer: Managed Care, Other (non HMO)

## 2023-06-25 ENCOUNTER — Other Ambulatory Visit: Payer: Self-pay | Admitting: Physician Assistant

## 2023-06-25 DIAGNOSIS — F418 Other specified anxiety disorders: Secondary | ICD-10-CM

## 2023-07-08 ENCOUNTER — Ambulatory Visit: Payer: Managed Care, Other (non HMO) | Admitting: Physician Assistant

## 2023-07-19 ENCOUNTER — Other Ambulatory Visit: Payer: Self-pay | Admitting: Physician Assistant

## 2023-07-19 DIAGNOSIS — I10 Essential (primary) hypertension: Secondary | ICD-10-CM

## 2023-07-20 LAB — HM COLONOSCOPY

## 2023-07-25 ENCOUNTER — Other Ambulatory Visit: Payer: Self-pay | Admitting: Physician Assistant

## 2023-07-25 DIAGNOSIS — F418 Other specified anxiety disorders: Secondary | ICD-10-CM

## 2023-08-04 ENCOUNTER — Ambulatory Visit: Payer: Managed Care, Other (non HMO) | Admitting: Physician Assistant

## 2023-08-22 ENCOUNTER — Other Ambulatory Visit: Payer: Self-pay | Admitting: Physician Assistant

## 2023-08-22 DIAGNOSIS — I10 Essential (primary) hypertension: Secondary | ICD-10-CM

## 2023-08-22 DIAGNOSIS — E782 Mixed hyperlipidemia: Secondary | ICD-10-CM

## 2023-08-24 ENCOUNTER — Other Ambulatory Visit: Payer: Self-pay | Admitting: Physician Assistant

## 2023-08-24 ENCOUNTER — Telehealth: Payer: Self-pay

## 2023-08-24 DIAGNOSIS — E782 Mixed hyperlipidemia: Secondary | ICD-10-CM

## 2023-08-24 DIAGNOSIS — I10 Essential (primary) hypertension: Secondary | ICD-10-CM

## 2023-08-24 MED ORDER — LISINOPRIL-HYDROCHLOROTHIAZIDE 20-25 MG PO TABS: 1.0000 | ORAL_TABLET | Freq: Every day | ORAL | 0 refills | Status: DC

## 2023-08-24 MED ORDER — ATORVASTATIN CALCIUM 10 MG PO TABS: 10.0000 mg | ORAL_TABLET | Freq: Every day | ORAL | 0 refills | Status: DC

## 2023-08-24 NOTE — Telephone Encounter (Signed)
Rx sent 

## 2023-08-24 NOTE — Telephone Encounter (Signed)
Patient has called and been informed she is due for an appointment. Patient has scheduled a fasting appointment with you and is requesting if she can still get enough medication until her appointment which is the atorvastatin and lisinopril/hydrochlorothiazide. Can you send these in?

## 2023-09-03 ENCOUNTER — Other Ambulatory Visit: Payer: Self-pay | Admitting: Physician Assistant

## 2023-09-03 DIAGNOSIS — F418 Other specified anxiety disorders: Secondary | ICD-10-CM

## 2023-09-09 ENCOUNTER — Ambulatory Visit (INDEPENDENT_AMBULATORY_CARE_PROVIDER_SITE_OTHER): Payer: Managed Care, Other (non HMO) | Admitting: Physician Assistant

## 2023-09-09 ENCOUNTER — Encounter: Payer: Self-pay | Admitting: Physician Assistant

## 2023-09-09 VITALS — BP 122/78 | HR 80 | Temp 98.0°F | Resp 16 | Ht 63.5 in | Wt 252.6 lb

## 2023-09-09 DIAGNOSIS — J3089 Other allergic rhinitis: Secondary | ICD-10-CM

## 2023-09-09 DIAGNOSIS — F418 Other specified anxiety disorders: Secondary | ICD-10-CM | POA: Diagnosis not present

## 2023-09-09 DIAGNOSIS — E559 Vitamin D deficiency, unspecified: Secondary | ICD-10-CM | POA: Diagnosis not present

## 2023-09-09 DIAGNOSIS — E782 Mixed hyperlipidemia: Secondary | ICD-10-CM

## 2023-09-09 DIAGNOSIS — Z1231 Encounter for screening mammogram for malignant neoplasm of breast: Secondary | ICD-10-CM

## 2023-09-09 DIAGNOSIS — H6992 Unspecified Eustachian tube disorder, left ear: Secondary | ICD-10-CM

## 2023-09-09 DIAGNOSIS — I10 Essential (primary) hypertension: Secondary | ICD-10-CM | POA: Diagnosis not present

## 2023-09-09 MED ORDER — LISINOPRIL-HYDROCHLOROTHIAZIDE 20-25 MG PO TABS: 1.0000 | ORAL_TABLET | Freq: Every day | ORAL | 5 refills | Status: DC

## 2023-09-09 MED ORDER — CITALOPRAM HYDROBROMIDE 40 MG PO TABS: 40.0000 mg | ORAL_TABLET | Freq: Every day | ORAL | 5 refills | Status: DC

## 2023-09-09 MED ORDER — MONTELUKAST SODIUM 10 MG PO TABS: 10.0000 mg | ORAL_TABLET | Freq: Every day | ORAL | 5 refills | Status: DC

## 2023-09-09 MED ORDER — VITAMIN D (ERGOCALCIFEROL) 1.25 MG (50000 UNIT) PO CAPS: 50000.0000 [IU] | ORAL_CAPSULE | ORAL | 5 refills | Status: DC

## 2023-09-09 MED ORDER — FLUTICASONE PROPIONATE 50 MCG/ACT NA SUSP: 2.0000 | Freq: Every day | NASAL | 2 refills | Status: DC

## 2023-09-09 MED ORDER — ATORVASTATIN CALCIUM 10 MG PO TABS: 10.0000 mg | ORAL_TABLET | Freq: Every day | ORAL | 0 refills | Status: DC

## 2023-09-09 NOTE — Progress Notes (Signed)
Subjective:  Patient ID: Allison Baldwin, female    DOB: 23-Apr-1976  Age: 47 y.o. MRN: [id number]  Chief Complaint  Patient presents with   Medical Management of Chronic Issues    HPI Pt in today for follow up of hypertension  - she denies chest pain/dyspnea She is currently taking lisinoprilhctz 20/25mg  qd Pt has been watching diet and decreased carbs/sugars -   Pt with diagnosis of anxiety - states she is doing well on celexa 40mg  qd  Pt with Vit D def - taking weekly supplement and due for labwork  Mixed hyperlipidemia  Pt presents with hyperlipidemia.Compliance with treatment has been good -The patient is compliant with medications, maintains a low cholesterol diet , follows up as directed ,. The patient denies experiencing any hypercholesterolemia related symptoms. Pt taking lipitor 10mg  qd  Pt with allergic rhinitis - uses fluticasone and singulair  Pt would like to schedule screening mammogram       09/09/2023   10:29 AM 03/31/2023    2:11 PM 02/23/2023    2:17 PM 07/29/2022    9:22 AM 07/22/2021    9:01 AM  Depression screen PHQ 2/9  Decreased Interest 0 0 3 0 2  Down, Depressed, Hopeless 0 0 3 1 2   PHQ - 2 Score 0 0 6 1 4   Altered sleeping  3 0 3 3  Tired, decreased energy  1 3 2 3   Change in appetite  0 0 2 3  Feeling bad or failure about yourself   0 1 1 0  Trouble concentrating  0 1 0 1  Moving slowly or fidgety/restless  0 0 0 3  Suicidal thoughts  0 0 0 0  PHQ-9 Score  4 11 9 17   Difficult doing work/chores  Not difficult at all Very difficult Not difficult at all Not difficult at all        02/20/2020    6:40 AM 02/23/2023    2:19 PM  Fall Risk  Falls in the past year?  0  Was there an injury with Fall?  0  Fall Risk Category Calculator  0  (RETIRED) Patient Fall Risk Level Low fall risk   Patient at Risk for Falls Due to  History of fall(s)  Fall risk Follow up  Falls evaluation completed     CONSTITUTIONAL: Negative for chills, fatigue, fever,  unintentional weight gain and unintentional weight loss.  E/N/T: Negative for ear pain, nasal congestion and sore throat.  CARDIOVASCULAR: Negative for chest pain, dizziness, palpitations and pedal edema.  RESPIRATORY: Negative for recent cough and dyspnea.  GASTROINTESTINAL: Negative for abdominal pain, acid reflux symptoms, constipation, diarrhea, nausea and vomiting.  MSK: Negative for arthralgias and myalgias.  INTEGUMENTARY: Negative for rash.  NEUROLOGICAL: Negative for dizziness and headaches.  PSYCHIATRIC: Negative for sleep disturbance and to question depression screen.  Negative for depression, negative for anhedonia.       Current Outpatient Medications:    azelastine (OPTIVAR) 0.05 % ophthalmic solution, Apply 1 drop to eye 2 (two) times daily., Disp: , Rfl:    atorvastatin (LIPITOR) 10 MG tablet, Take 1 tablet (10 mg total) by mouth daily., Disp: 30 tablet, Rfl: 0   citalopram (CELEXA) 40 MG tablet, Take 1 tablet (40 mg total) by mouth daily., Disp: 30 tablet, Rfl: 5   fluticasone (FLONASE) 50 MCG/ACT nasal spray, Place 2 sprays into both nostrils daily., Disp: 9.9 mL, Rfl: 2   lisinopril-hydrochlorothiazide (ZESTORETIC) 20-25 MG tablet, Take 1 tablet by mouth  daily., Disp: 30 tablet, Rfl: 5   montelukast (SINGULAIR) 10 MG tablet, Take 1 tablet (10 mg total) by mouth at bedtime., Disp: 30 tablet, Rfl: 5   Vitamin D, Ergocalciferol, (DRISDOL) 1.25 MG (50000 UNIT) CAPS capsule, Take 1 capsule (50,000 Units total) by mouth every 7 (seven) days., Disp: 5 capsule, Rfl: 5  Past Medical History:  Diagnosis Date   Depression    Hyperlipidemia    Hypertension    Vitamin D deficiency    Objective:  PHYSICAL EXAM:   VS: BP 122/78   Pulse 80   Temp 98 F (36.7 C)   Resp 16   Ht 5' 3.5" (1.613 m)   Wt 252 lb 9.6 oz (114.6 kg)   SpO2 97%   BMI 44.04 kg/m   GEN: Well nourished, well developed, in no acute distress  HEENT: normal external ears and nose - normal external  auditory canals and TMS - hearing grossly normal - - Lips, Teeth and Gums - normal  Oropharynx - normal mucosa, palate, and posterior pharynx Cardiac: RRR; no murmurs, rubs, or gallops,no edema -  Respiratory:  normal respiratory rate and pattern with no distress - normal breath sounds with no rales, rhonchi, wheezes or rubs MS: no deformity or atrophy  Skin: warm and dry, no rash  Neuro:  Alert and Oriented x 3, - CN II-Xii grossly intact Psych: euthymic mood, appropriate affect and demeanor   Assessment & Plan:    Benign hypertension -     Lisinopril-hydroCHLOROthiazide; Take 1 tablet by mouth daily.  Dispense: 30 tablet; Refill: 1 Labwork pending Anxiety with depression Continue celexa 40mg  qd Allergic rhinitis Continue meds  Hyperlipidemia Watch diet Continue med Labwork pending  Screening for breast cancer Mammogram ordered  Vit D def Labwork pending Continue weekly vit D     Follow-up: Return in about 6 months (around 03/08/2024) for fasting physical with pap - 40 min.  An After Visit Summary was printed and given to the patient.  Jettie Pagan [facility] [phone]

## 2023-09-10 LAB — CBC WITH DIFFERENTIAL/PLATELET
Basophils Absolute: 0 10*3/uL (ref 0.0–0.2)
Basos: 1 %
EOS (ABSOLUTE): 0.4 10*3/uL (ref 0.0–0.4)
Eos: 5 %
Hematocrit: 42.8 % (ref 34.0–46.6)
Hemoglobin: 13.9 g/dL (ref 11.1–15.9)
Immature Grans (Abs): 0 10*3/uL (ref 0.0–0.1)
Immature Granulocytes: 1 %
Lymphocytes Absolute: 1.4 10*3/uL (ref 0.7–3.1)
Lymphs: 18 %
MCH: 29.2 pg (ref 26.6–33.0)
MCHC: 32.5 g/dL (ref 31.5–35.7)
MCV: 90 fL (ref 79–97)
Monocytes Absolute: 0.4 10*3/uL (ref 0.1–0.9)
Monocytes: 5 %
Neutrophils Absolute: 5.4 10*3/uL (ref 1.4–7.0)
Neutrophils: 70 %
Platelets: 253 10*3/uL (ref 150–450)
RBC: 4.76 x10E6/uL (ref 3.77–5.28)
RDW: 12.7 % (ref 11.7–15.4)
WBC: 7.6 10*3/uL (ref 3.4–10.8)

## 2023-09-10 LAB — COMPREHENSIVE METABOLIC PANEL
ALT: 14 [IU]/L (ref 0–32)
AST: 16 [IU]/L (ref 0–40)
Albumin: 4.3 g/dL (ref 3.9–4.9)
Alkaline Phosphatase: 104 [IU]/L (ref 44–121)
BUN/Creatinine Ratio: 10 (ref 9–23)
BUN: 9 mg/dL (ref 6–24)
Bilirubin Total: 0.4 mg/dL (ref 0.0–1.2)
CO2: 24 mmol/L (ref 20–29)
Calcium: 9.5 mg/dL (ref 8.7–10.2)
Chloride: 102 mmol/L (ref 96–106)
Creatinine, Ser: 0.86 mg/dL (ref 0.57–1.00)
Globulin, Total: 2.6 g/dL (ref 1.5–4.5)
Glucose: 86 mg/dL (ref 70–99)
Potassium: 3.9 mmol/L (ref 3.5–5.2)
Sodium: 143 mmol/L (ref 134–144)
Total Protein: 6.9 g/dL (ref 6.0–8.5)
eGFR: 85 mL/min/{1.73_m2} (ref >59)

## 2023-09-10 LAB — LIPID PANEL
Chol/HDL Ratio: 3.3 ratio (ref 0.0–4.4)
Cholesterol, Total: 157 mg/dL (ref 100–199)
HDL: 47 mg/dL (ref >39)
LDL Chol Calc (NIH): 93 mg/dL (ref 0–99)
Triglycerides: 93 mg/dL (ref 0–149)
VLDL Cholesterol Cal: 17 mg/dL (ref 5–40)

## 2023-09-10 LAB — VITAMIN D 25 HYDROXY (VIT D DEFICIENCY, FRACTURES): Vit D, 25-Hydroxy: 45.8 ng/mL (ref 30.0–100.0)

## 2023-10-13 ENCOUNTER — Ambulatory Visit
Admission: RE | Admit: 2023-10-13 | Discharge: 2023-10-13 | Disposition: A | Discharge status: Home / Self Care | Payer: Managed Care, Other (non HMO) | Source: Ambulatory Visit | Attending: Physician Assistant | Admitting: Physician Assistant

## 2023-10-13 DIAGNOSIS — Z1231 Encounter for screening mammogram for malignant neoplasm of breast: Secondary | ICD-10-CM

## 2023-10-29 ENCOUNTER — Other Ambulatory Visit: Payer: Self-pay | Admitting: Physician Assistant

## 2023-10-29 DIAGNOSIS — E782 Mixed hyperlipidemia: Secondary | ICD-10-CM

## 2023-12-23 ENCOUNTER — Ambulatory Visit (INDEPENDENT_AMBULATORY_CARE_PROVIDER_SITE_OTHER): Payer: Managed Care, Other (non HMO) | Admitting: Physician Assistant

## 2023-12-23 ENCOUNTER — Encounter: Payer: Self-pay | Admitting: Physician Assistant

## 2023-12-23 VITALS — BP 120/82 | HR 83 | Temp 97.3°F | Resp 18 | Ht 63.5 in | Wt 265.0 lb

## 2023-12-23 DIAGNOSIS — J3489 Other specified disorders of nose and nasal sinuses: Secondary | ICD-10-CM

## 2023-12-23 MED ORDER — CEPHALEXIN 500 MG PO CAPS: 500.0000 mg | ORAL_CAPSULE | Freq: Two times a day (BID) | ORAL | 0 refills | Status: DC

## 2023-12-23 MED ORDER — MUPIROCIN 2 % EX OINT: 1.0000 | TOPICAL_OINTMENT | Freq: Two times a day (BID) | CUTANEOUS | 0 refills | Status: DC

## 2023-12-23 NOTE — Progress Notes (Signed)
   Acute Office Visit  Subjective:    Patient ID: Allison Baldwin, female    DOB: 1976-07-23, 48 y.o.   MRN: [id number]  Chief Complaint  Patient presents with   Nose lesion    HPI: Patient is in today for complaints of a sore on the inside of her left nostril - has been there a few days and getting slightly larger Denies fever/malaise Denies cough, cold , congestion   Current Outpatient Medications:    atorvastatin (LIPITOR) 10 MG tablet, Take 1 tablet by mouth once daily, Disp: 30 tablet, Rfl: 3   azelastine (OPTIVAR) 0.05 % ophthalmic solution, Apply 1 drop to eye 2 (two) times daily., Disp: , Rfl:    cephALEXin (KEFLEX) 500 MG capsule, Take 1 capsule (500 mg total) by mouth 2 (two) times daily., Disp: 14 capsule, Rfl: 0   citalopram (CELEXA) 40 MG tablet, Take 1 tablet (40 mg total) by mouth daily., Disp: 30 tablet, Rfl: 5   fluticasone (FLONASE) 50 MCG/ACT nasal spray, Place 2 sprays into both nostrils daily., Disp: 9.9 mL, Rfl: 2   lisinopril-hydrochlorothiazide (ZESTORETIC) 20-25 MG tablet, Take 1 tablet by mouth daily., Disp: 30 tablet, Rfl: 5   methocarbamol (ROBAXIN) 500 MG tablet, Take 500 mg by mouth 3 (three) times daily as needed., Disp: , Rfl:    montelukast (SINGULAIR) 10 MG tablet, Take 1 tablet (10 mg total) by mouth at bedtime., Disp: 30 tablet, Rfl: 5   mupirocin ointment (BACTROBAN) 2 %, Apply 1 Application topically 2 (two) times daily., Disp: 22 g, Rfl: 0   Vitamin D, Ergocalciferol, (DRISDOL) 1.25 MG (50000 UNIT) CAPS capsule, Take 1 capsule (50,000 Units total) by mouth every 7 (seven) days., Disp: 5 capsule, Rfl: 5  No Known Allergies  ROS CONSTITUTIONAL: Negative for chills, fatigue, fever,  E/N/T: see HPI CARDIOVASCULAR: Negative for chest pain,  RESPIRATORY: Negative for recent cough and dyspnea.       Objective:    PHYSICAL EXAM:   BP 120/82 (BP Location: Left Arm, Patient Position: Sitting, Cuff Size: Large)   Pulse 83   Temp (!) 97.3 F  (36.3 C) (Temporal)   Resp 18   Ht 5' 3.5" (1.613 m)   Wt 265 lb (120.2 kg)   LMP  (LMP Unknown)   SpO2 100%   BMI 46.21 kg/m    GEN: Well nourished, well developed, in no acute distress  HEENT: normal external ears and nose - normal external auditory canals and TMS - right nostril normal - small pustule noted on inside of left nares - Oropharynx - normal mucosa, palate, and posterior pharynx Cardiac: RRR; no murmurs,  Respiratory:  normal respiratory rate and pattern with no distress - normal breath sounds with no rales, rhonchi, wheezes or rubs     Assessment & Plan:    Infected lesion in nose -     Cephalexin; Take 1 capsule (500 mg total) by mouth 2 (two) times daily.  Dispense: 14 capsule; Refill: 0 -     Mupirocin; Apply 1 Application topically 2 (two) times daily.  Dispense: 22 g; Refill: 0    Warm compresses Follow-up: Return if symptoms worsen or fail to improve.  An After Visit Summary was printed and given to the patient.  Jettie Pagan [facility] [phone]

## 2023-12-29 ENCOUNTER — Encounter: Payer: Self-pay | Admitting: Physician Assistant

## 2023-12-29 ENCOUNTER — Ambulatory Visit (INDEPENDENT_AMBULATORY_CARE_PROVIDER_SITE_OTHER): Payer: Managed Care, Other (non HMO) | Admitting: Physician Assistant

## 2023-12-29 VITALS — BP 110/60 | HR 90 | Temp 97.4°F | Ht 63.5 in | Wt 252.4 lb

## 2023-12-29 DIAGNOSIS — R0981 Nasal congestion: Secondary | ICD-10-CM | POA: Diagnosis not present

## 2023-12-29 MED ORDER — PREDNISONE 20 MG PO TABS: ORAL_TABLET | ORAL | 0 refills | Status: AC

## 2023-12-29 MED ORDER — BENZONATATE 200 MG PO CAPS: 200.0000 mg | ORAL_CAPSULE | Freq: Two times a day (BID) | ORAL | 0 refills | Status: DC | PRN

## 2023-12-29 NOTE — Progress Notes (Unsigned)
 Acute Office Visit  Subjective:    Patient ID: Allison Baldwin, female    DOB: 02/14/1976, 48 y.o.   MRN: [id number]  No chief complaint on file.  HPI: Patient is in today for chills, fatigue, fever, congestion, cough, shortness of breath, dizziness and headaches. Patient states it started Saturday and she has been in the bed for 3 days.  Discussed the use of AI scribe software for clinical note transcription with the patient, who gave verbal consent to proceed.  History of Present Illness   The patient presents with a cough and sinus congestion that started on Sunday. She has tried over-the-counter remedies, but these have not provided relief. The patient reports no productive cough, but has experienced sinus drainage. She has been experiencing fluctuations in body temperature, feeling both hot and cold. The patient reports that other members of her household have been sick with similar symptoms. She woke up in the middle of the night, unable to catch her breath after a coughing episode, which was a frightening experience. She has not experienced anything like this recently. The patient has been feeling fullness and itchiness in her ears. She has previously been prescribed Keflex, but is not currently taking it. The patient has a history of asthma, but it has not been an issue recently.       Past Medical History:  Diagnosis Date   Depression    Hyperlipidemia    Hypertension    Vitamin D deficiency     Past Surgical History:  Procedure Laterality Date   BACK SURGERY  2008   BREAST REDUCTION SURGERY Bilateral 02/20/2020   Procedure: BILATERAL BREAST REDUCTION;  Surgeon: Glenna Fellows, MD;  Location: [facility];  Service: Plastics;  Laterality: Bilateral;   REDUCTION MAMMAPLASTY      Family History  Problem Relation Age of Onset   Hypothyroidism Mother    Congestive Heart Failure Father    Breast cancer Paternal Grandmother     Social History    Socioeconomic History   Marital status: Single    Spouse name: Not on file   Number of children: Not on file   Years of education: Not on file   Highest education level: Not on file  Occupational History   Not on file  Tobacco Use   Smoking status: Never   Smokeless tobacco: Never  Substance and Sexual Activity   Alcohol use: Never   Drug use: Never   Sexual activity: Not on file  Other Topics Concern   Not on file  Social History Narrative   Not on file   Social Drivers of Health   Financial Resource Strain: Not on file  Food Insecurity: Not on file  Transportation Needs: Not on file  Physical Activity: Not on file  Stress: Not on file  Social Connections: Not on file  Intimate Partner Violence: Not on file    Outpatient Medications Prior to Visit  Medication Sig Dispense Refill   atorvastatin (LIPITOR) 10 MG tablet Take 1 tablet by mouth once daily 30 tablet 3   cephALEXin (KEFLEX) 500 MG capsule Take 1 capsule (500 mg total) by mouth 2 (two) times daily. 14 capsule 0   citalopram (CELEXA) 40 MG tablet Take 1 tablet (40 mg total) by mouth daily. 30 tablet 5   lisinopril-hydrochlorothiazide (ZESTORETIC) 20-25 MG tablet Take 1 tablet by mouth daily. 30 tablet 5   methocarbamol (ROBAXIN) 500 MG tablet Take 500 mg by mouth 3 (three) times daily as needed  for muscle spasms.     mupirocin ointment (BACTROBAN) 2 % Apply 1 Application topically 2 (two) times daily. 22 g 0   montelukast (SINGULAIR) 10 MG tablet Take 1 tablet (10 mg total) by mouth at bedtime. (Patient not taking: Reported on 12/29/2023) 30 tablet 5   Vitamin D, Ergocalciferol, (DRISDOL) 1.25 MG (50000 UNIT) CAPS capsule Take 1 capsule (50,000 Units total) by mouth every 7 (seven) days. (Patient not taking: Reported on 12/29/2023) 5 capsule 5   azelastine (OPTIVAR) 0.05 % ophthalmic solution Apply 1 drop to eye 2 (two) times daily.     fluticasone (FLONASE) 50 MCG/ACT nasal spray Place 2 sprays into both nostrils  daily. (Patient not taking: Reported on 12/29/2023) 9.9 mL 2   No facility-administered medications prior to visit.    No Known Allergies  Review of Systems  Constitutional:  Positive for chills, fatigue and fever.  HENT:  Positive for congestion. Negative for ear pain and sinus pain.   Respiratory:  Positive for cough and shortness of breath.   Cardiovascular:  Negative for chest pain.  Gastrointestinal:  Negative for abdominal pain, constipation, diarrhea, nausea and vomiting.  Musculoskeletal:  Negative for myalgias.  Neurological:  Positive for dizziness and headaches.       Objective:        12/29/2023    1:11 PM 12/23/2023    3:33 PM 09/09/2023   10:21 AM  Vitals with BMI  Height 5' 3.5" 5' 3.5" 5' 3.5"  Weight 252 lbs 6 oz 265 lbs 252 lbs 10 oz  BMI 44 46.2 44.04  Systolic 110 120 562  Diastolic 60 82 78  Pulse 90 83 80    No data found.   Physical Exam Vitals reviewed.  Constitutional:      Appearance: Normal appearance.  HENT:     Right Ear: Hearing normal. No decreased hearing noted. No drainage or tenderness. No middle ear effusion. Tympanic membrane is not injected or bulging.     Left Ear: Hearing normal. No decreased hearing noted. No drainage or tenderness. A middle ear effusion is present. Tympanic membrane is bulging. Tympanic membrane is not injected.     Mouth/Throat:     Mouth: Mucous membranes are moist.     Tongue: No lesions. Tongue does not deviate from midline.     Palate: No mass and lesions.     Pharynx: Oropharynx is clear. Posterior oropharyngeal erythema present. No oropharyngeal exudate.     Tonsils: No tonsillar exudate or tonsillar abscesses.  Cardiovascular:     Rate and Rhythm: Normal rate and regular rhythm.     Heart sounds: Normal heart sounds.  Pulmonary:     Effort: Pulmonary effort is normal.     Breath sounds: Normal breath sounds.  Abdominal:     General: Bowel sounds are normal.     Palpations: Abdomen is soft.      Tenderness: There is no abdominal tenderness.  Neurological:     Mental Status: She is alert and oriented to person, place, and time.  Psychiatric:        Mood and Affect: Mood normal.        Behavior: Behavior normal.     Health Maintenance Due  Topic Date Due   Cervical Cancer Screening (Pap smear)  07/30/2023    There are no preventive care reminders to display for this patient.   Lab Results  Component Value Date   TSH 2.190 02/23/2023   Lab Results  Component Value Date  WBC 7.6 09/09/2023   HGB 13.9 09/09/2023   HCT 42.8 09/09/2023   MCV 90 09/09/2023   PLT 253 09/09/2023   Lab Results  Component Value Date   NA 143 09/09/2023   K 3.9 09/09/2023   CO2 24 09/09/2023   GLUCOSE 86 09/09/2023   BUN 9 09/09/2023   CREATININE 0.86 09/09/2023   BILITOT 0.4 09/09/2023   ALKPHOS 104 09/09/2023   AST 16 09/09/2023   ALT 14 09/09/2023   PROT 6.9 09/09/2023   ALBUMIN 4.3 09/09/2023   CALCIUM 9.5 09/09/2023   ANIONGAP 9 02/14/2020   EGFR 85 09/09/2023   Lab Results  Component Value Date   CHOL 157 09/09/2023   Lab Results  Component Value Date   HDL 47 09/09/2023   Lab Results  Component Value Date   LDLCALC 93 09/09/2023   Lab Results  Component Value Date   TRIG 93 09/09/2023   Lab Results  Component Value Date   CHOLHDL 3.3 09/09/2023   No results found for: "HGBA1C"     Assessment & Plan:  Sinus congestion Assessment & Plan: Symptoms of sinus congestion, cough, and intermittent fever since Sunday. Family members also ill. No improvement with over-the-counter treatments. Physical exam reveals sinus tenderness and fluid in left ear. No signs of bacterial infection in throat or ears. -Start Prednisone taper to reduce inflammation and improve symptoms. -Prescribe Tessalon Perles for cough suppression. -Advise patient to monitor for worsening symptoms including consistent fever over 101, or increased/darker sinus drainage. If symptoms worsen,  consider antibiotics and chest x-ray. -Advise rest and increased fluid intake, including water, Pedialyte, or Liquid IV for electrolyte replenishment.  Orders: -     POC COVID-19 BinaxNow -     POCT Influenza A/B -     predniSONE; Take 3 tablets (60 mg total) by mouth daily with breakfast for 3 days, THEN 2 tablets (40 mg total) daily with breakfast for 3 days, THEN 1 tablet (20 mg total) daily with breakfast for 3 days.  Dispense: 18 tablet; Refill: 0 -     Benzonatate; Take 1 capsule (200 mg total) by mouth 2 (two) times daily as needed for cough.  Dispense: 30 capsule; Refill: 0     Meds ordered this encounter  Medications   predniSONE (DELTASONE) 20 MG tablet    Sig: Take 3 tablets (60 mg total) by mouth daily with breakfast for 3 days, THEN 2 tablets (40 mg total) daily with breakfast for 3 days, THEN 1 tablet (20 mg total) daily with breakfast for 3 days.    Dispense:  18 tablet    Refill:  0   benzonatate (TESSALON) 200 MG capsule    Sig: Take 1 capsule (200 mg total) by mouth 2 (two) times daily as needed for cough.    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 -Provide work excuse for today (12/29/2023) through Friday (12/31/2023), with return to work on Saturday (01/01/2024).         Follow-up: No follow-ups on file.  An After Visit Summary was printed and given to the patient.  Langley Gauss, [place] [facility] [phone]

## 2023-12-29 NOTE — Patient Instructions (Signed)
 VISIT SUMMARY:  You came in today because of a cough and sinus congestion that started on Sunday. You have tried over-the-counter remedies without relief and have experienced sinus drainage, fluctuating body temperature, and a frightening episode of breathlessness during a coughing fit. Other members of your household have similar symptoms. You also reported fullness and itchiness in your ears. You have a history of asthma, but it has not been an issue recently.  YOUR PLAN:  -ACUTE SINUSITIS: Acute sinusitis is an inflammation of the sinuses that can cause congestion, cough, and fever. You will start a Prednisone taper to reduce inflammation and improve your symptoms. Tessalon Perles will be prescribed to help suppress your cough. Please monitor for worsening symptoms such as a consistent fever over 101F or increased/darker sinus drainage. If your symptoms worsen, antibiotics and a chest x-ray may be considered. Make sure to rest and increase your fluid intake, including water, Pedialyte, or Liquid IV for electrolyte replenishment.  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 You are excused from work from today (12/29/2023) through Friday (12/31/2023) and can return to work on Saturday (01/01/2024).  INSTRUCTIONS:  Please follow the prescribed medication regimen and monitor your symptoms closely. If you experience a consistent fever over 101F or increased/darker sinus drainage, contact our office immediately. Ensure you rest and stay hydrated. You are excused from work until Saturday (01/01/2024).

## 2023-12-31 ENCOUNTER — Ambulatory Visit: Payer: Self-pay | Admitting: Physician Assistant

## 2023-12-31 NOTE — Telephone Encounter (Signed)
 Copied From CRM [id number]. Reason for Triage: Patient was seen for cold/flu like symptoms on Feb 26th. Symptoms are getting worse. Provider told her to call in to try to schedule a chest xray.    Chief Complaint: Cough Symptoms: Cough, green sputum production, nasal congestion, chills, mild shortness of breath, chest pain with cough  Frequency: Frequent  Pertinent Negatives: Patient denies fever Disposition: [] ED /[] Urgent Care (no appt availability in office) / [x] Appointment(In office/virtual)/ []  [facility]/ [] Home Care/ [] Refused Recommended Disposition /[] [facility]/ []  Follow-up with PCP Additional Notes: Patient reports shew as seen 2 days ago for cough and congestion and was told to call back if her symptoms worsen. She states that she is now experiencing green sputum production, mild shortness of breath, and chest/rib pain when coughing. Appointment made for the patient on Monday.      Reason for Disposition  Cough has been present for > 3 weeks    Recently evaluated, worsening symptoms  Answer Assessment - Initial Assessment Questions 1. ONSET: "When did the cough begin?"      5 days ago   2. SEVERITY: "How bad is the cough today?"      Moderate to severe  3. SPUTUM: "Describe the color of your sputum" (none, dry cough; clear, white, yellow, green)     Green sputum  4. HEMOPTYSIS: "Are you coughing up any blood?" If so ask: "How much?" (flecks, streaks, tablespoons, etc.)     No 5. DIFFICULTY BREATHING: "Are you having difficulty breathing?" If Yes, ask: "How bad is it?" (e.g., mild, moderate, severe)    - MILD: No SOB at rest, mild SOB with walking, speaks normally in sentences, can lie down, no retractions, pulse < 100.    - MODERATE: SOB at rest, SOB with minimal exertion and prefers to sit, cannot lie down flat, speaks in phrases, mild retractions, audible wheezing, pulse 100-120.    - SEVERE: Very SOB at rest, speaks in single words, struggling to  breathe, sitting hunched forward, retractions, pulse > 120      Mild 6. FEVER: "Do you have a fever?" If Yes, ask: "What is your temperature, how was it measured, and when did it start?"     Chills, has not checked  7. CARDIAC HISTORY: "Do you have any history of heart disease?" (e.g., heart attack, congestive heart failure)      Hypertension  8. LUNG HISTORY: "Do you have any history of lung disease?"  (e.g., pulmonary embolus, asthma, emphysema)     No 9. PE RISK FACTORS: "Do you have a history of blood clots?" (or: recent major surgery, recent prolonged travel, bedridden)     No 10. OTHER SYMPTOMS: "Do you have any other symptoms?" (e.g., runny nose, wheezing, chest pain)       Nasal congestion, chest pain with cough  11. PREGNANCY: "Is there any chance you are pregnant?" "When was your last menstrual period?"       No 12. TRAVEL: "Have you traveled out of the country in the last month?" (e.g., travel history, exposures)       No  Protocols used: Cough - Acute Productive-A-AH

## 2024-01-03 ENCOUNTER — Ambulatory Visit: Payer: Managed Care, Other (non HMO) | Admitting: Family Medicine

## 2024-01-03 DIAGNOSIS — R0981 Nasal congestion: Secondary | ICD-10-CM | POA: Insufficient documentation

## 2024-01-03 NOTE — Assessment & Plan Note (Signed)
 Symptoms of sinus congestion, cough, and intermittent fever since Sunday. Family members also ill. No improvement with over-the-counter treatments. Physical exam reveals sinus tenderness and fluid in left ear. No signs of bacterial infection in throat or ears. -Start Prednisone taper to reduce inflammation and improve symptoms. -Prescribe Tessalon Perles for cough suppression. -Advise patient to monitor for worsening symptoms including consistent fever over 101, or increased/darker sinus drainage. If symptoms worsen, consider antibiotics and chest x-ray. -Advise rest and increased fluid intake, including water, Pedialyte, or Liquid IV for electrolyte replenishment.

## 2024-01-05 ENCOUNTER — Encounter: Payer: Self-pay | Admitting: Physician Assistant

## 2024-01-05 LAB — POC COVID19 BINAXNOW: SARS Coronavirus 2 Ag: NEGATIVE

## 2024-01-05 LAB — POCT INFLUENZA A/B
Influenza A, POC: NEGATIVE
Influenza B, POC: NEGATIVE

## 2024-03-08 ENCOUNTER — Encounter (HOSPITAL_COMMUNITY): Payer: Self-pay

## 2024-03-09 ENCOUNTER — Other Ambulatory Visit: Payer: Self-pay | Admitting: Physician Assistant

## 2024-03-09 DIAGNOSIS — E782 Mixed hyperlipidemia: Secondary | ICD-10-CM

## 2024-03-15 ENCOUNTER — Encounter: Payer: Self-pay | Admitting: Physician Assistant

## 2024-03-15 ENCOUNTER — Ambulatory Visit: Payer: Managed Care, Other (non HMO) | Admitting: Physician Assistant

## 2024-03-15 VITALS — BP 118/82 | HR 98 | Temp 98.0°F | Ht 63.5 in | Wt 263.2 lb

## 2024-03-15 DIAGNOSIS — F418 Other specified anxiety disorders: Secondary | ICD-10-CM

## 2024-03-15 DIAGNOSIS — E782 Mixed hyperlipidemia: Secondary | ICD-10-CM

## 2024-03-15 DIAGNOSIS — I10 Essential (primary) hypertension: Secondary | ICD-10-CM

## 2024-03-15 MED ORDER — METHOCARBAMOL 500 MG PO TABS
500.0000 mg | ORAL_TABLET | Freq: Every evening | ORAL | 1 refills | Status: DC | PRN
Start: 2024-03-15 — End: 2024-09-26

## 2024-03-15 MED ORDER — ATORVASTATIN CALCIUM 10 MG PO TABS: 10.0000 mg | ORAL_TABLET | Freq: Every day | ORAL | 5 refills | Status: AC

## 2024-03-15 MED ORDER — CITALOPRAM HYDROBROMIDE 40 MG PO TABS: 40.0000 mg | ORAL_TABLET | Freq: Every day | ORAL | 5 refills | Status: AC

## 2024-03-15 MED ORDER — LISINOPRIL-HYDROCHLOROTHIAZIDE 20-25 MG PO TABS: 1.0000 | ORAL_TABLET | Freq: Every day | ORAL | 5 refills | Status: DC

## 2024-03-15 NOTE — Progress Notes (Signed)
 Subjective:  Patient ID: Allison Baldwin, female    DOB: 05-19-1976  Age: 48 y.o. MRN: [id number]  Chief Complaint  Patient presents with   Hypertension    HPI Pt presents for follow up of hypertension. The patient is tolerating the medication well without side effects. Compliance with treatment has been good; including taking medication as directed ,currently on zestoretic  20/25mg  qd Denies chest pain or dyspnea  Mixed hyperlipidemia  Pt presents with hyperlipidemia.  The patient is compliant with medications, maintains a low cholesterol diet , follows up as directed , and maintains an exercise regimen . The patient denies experiencing any hypercholesterolemia related symptoms.  Takes lipitor 10mg  qd  Pt takes celexa  40mg  qd for depression/anxiety and symptoms are stable      03/15/2024    8:55 AM 09/09/2023   10:29 AM 03/31/2023    2:11 PM 02/23/2023    2:17 PM 07/29/2022    9:22 AM  Depression screen PHQ 2/9  Decreased Interest 0 0 0 3 0  Down, Depressed, Hopeless 1 0 0 3 1  PHQ - 2 Score 1 0 0 6 1  Altered sleeping 1  3 0 3  Tired, decreased energy 1  1 3 2   Change in appetite 0  0 0 2  Feeling bad or failure about yourself  0  0 1 1  Trouble concentrating 0  0 1 0  Moving slowly or fidgety/restless 0  0 0 0  Suicidal thoughts 0  0 0 0  PHQ-9 Score 3  4 11 9   Difficult doing work/chores Not difficult at all  Not difficult at all Very difficult Not difficult at all        02/20/2020    6:40 AM 02/23/2023    2:19 PM 03/15/2024    8:55 AM  Fall Risk  Falls in the past year?  0 0  Was there an injury with Fall?  0 0  Fall Risk Category Calculator  0 0  (RETIRED) Patient Fall Risk Level Low fall risk    Patient at Risk for Falls Due to  History of fall(s) No Fall Risks  Fall risk Follow up  Falls evaluation completed      ROS CONSTITUTIONAL: Negative for chills, fatigue, fever, unintentional weight gain and unintentional weight loss.  E/N/T: Negative for ear pain,  nasal congestion and sore throat.  CARDIOVASCULAR: Negative for chest pain, dizziness, palpitations and pedal edema.  RESPIRATORY: Negative for recent cough and dyspnea.  GASTROINTESTINAL: Negative for abdominal pain, acid reflux symptoms, constipation, diarrhea, nausea and vomiting.  MSK:pt has intermittent low back pain after starting new job  INTEGUMENTARY: Negative for rash.  NEUROLOGICAL: Negative for dizziness and headaches.  PSYCHIATRIC: Negative for sleep disturbance and to question depression screen.  Negative for depression, negative for anhedonia.    Current Outpatient Medications:    atorvastatin  (LIPITOR) 10 MG tablet, Take 1 tablet (10 mg total) by mouth daily., Disp: 30 tablet, Rfl: 5   citalopram  (CELEXA ) 40 MG tablet, Take 1 tablet (40 mg total) by mouth daily., Disp: 30 tablet, Rfl: 5   lisinopril -hydrochlorothiazide  (ZESTORETIC ) 20-25 MG tablet, Take 1 tablet by mouth daily., Disp: 30 tablet, Rfl: 5   methocarbamol (ROBAXIN) 500 MG tablet, Take 1 tablet (500 mg total) by mouth at bedtime as needed for muscle spasms., Disp: 30 tablet, Rfl: 1  Past Medical History:  Diagnosis Date   Depression    Hyperlipidemia    Hypertension    Vitamin D  deficiency  Objective:  PHYSICAL EXAM:   BP 118/82   Pulse 98   Temp 98 F (36.7 C)   Ht 5' 3.5" (1.613 m)   Wt 263 lb 3.2 oz (119.4 kg)   SpO2 98%   BMI 45.89 kg/m    GEN: Well nourished, well developed, in no acute distress   Cardiac: RRR; no murmurs, rubs, or gallops,no edema -  Respiratory:  normal respiratory rate and pattern with no distress - normal breath sounds with no rales, rhonchi, wheezes or rubs  Skin: warm and dry, no rash  Neuro:  Alert and Oriented x 3,- CN II-Xii grossly intact Psych: euthymic mood, appropriate affect and demeanor  Assessment & Plan:    Mixed hyperlipidemia -     Comprehensive metabolic panel with GFR -     Lipid panel -     Atorvastatin  Calcium ; Take 1 tablet (10 mg total) by  mouth daily.  Dispense: 30 tablet; Refill: 5  Benign hypertension -     Comprehensive metabolic panel with GFR -     Lipid panel -     Lisinopril -hydroCHLOROthiazide ; Take 1 tablet by mouth daily.  Dispense: 30 tablet; Refill: 5  Anxiety with depression -     Citalopram  Hydrobromide; Take 1 tablet (40 mg total) by mouth daily.  Dispense: 30 tablet; Refill: 5  Other orders -     Methocarbamol; Take 1 tablet (500 mg total) by mouth at bedtime as needed for muscle spasms.  Dispense: 30 tablet; Refill: 1     Follow-up: Return in about 6 months (around 09/15/2024) for fasting physical with pap - 40 min.  An After Visit Summary was printed and given to the patient.  Anthonette Bastos [facility] [phone]

## 2024-03-16 ENCOUNTER — Ambulatory Visit: Payer: Self-pay | Admitting: Physician Assistant

## 2024-03-16 LAB — COMPREHENSIVE METABOLIC PANEL WITH GFR
ALT: 27 IU/L (ref 0–32)
AST: 27 IU/L (ref 0–40)
Albumin: 4.3 g/dL (ref 3.9–4.9)
Alkaline Phosphatase: 87 IU/L (ref 44–121)
BUN/Creatinine Ratio: 16 (ref 9–23)
BUN: 12 mg/dL (ref 6–24)
Bilirubin Total: 0.4 mg/dL (ref 0.0–1.2)
CO2: 23 mmol/L (ref 20–29)
Calcium: 9.5 mg/dL (ref 8.7–10.2)
Chloride: 103 mmol/L (ref 96–106)
Creatinine, Ser: 0.77 mg/dL (ref 0.57–1.00)
Globulin, Total: 2.2 g/dL (ref 1.5–4.5)
Glucose: 89 mg/dL (ref 70–99)
Potassium: 4.3 mmol/L (ref 3.5–5.2)
Sodium: 142 mmol/L (ref 134–144)
Total Protein: 6.5 g/dL (ref 6.0–8.5)
eGFR: 96 mL/min/{1.73_m2} (ref >59)

## 2024-03-16 LAB — LIPID PANEL
Chol/HDL Ratio: 3.5 ratio (ref 0.0–4.4)
Cholesterol, Total: 151 mg/dL (ref 100–199)
HDL: 43 mg/dL (ref >39)
LDL Chol Calc (NIH): 90 mg/dL (ref 0–99)
Triglycerides: 96 mg/dL (ref 0–149)
VLDL Cholesterol Cal: 18 mg/dL (ref 5–40)

## 2024-09-26 ENCOUNTER — Ambulatory Visit (INDEPENDENT_AMBULATORY_CARE_PROVIDER_SITE_OTHER): Payer: Self-pay | Admitting: Physician Assistant

## 2024-09-26 ENCOUNTER — Encounter: Payer: Self-pay | Admitting: Physician Assistant

## 2024-09-26 VITALS — BP 120/84 | HR 79 | Temp 98.3°F | Resp 18 | Ht 63.5 in | Wt 248.8 lb

## 2024-09-26 DIAGNOSIS — Z1231 Encounter for screening mammogram for malignant neoplasm of breast: Secondary | ICD-10-CM

## 2024-09-26 DIAGNOSIS — F418 Other specified anxiety disorders: Secondary | ICD-10-CM

## 2024-09-26 DIAGNOSIS — I10 Essential (primary) hypertension: Secondary | ICD-10-CM | POA: Diagnosis not present

## 2024-09-26 DIAGNOSIS — E782 Mixed hyperlipidemia: Secondary | ICD-10-CM

## 2024-09-26 DIAGNOSIS — M2559 Pain in other specified joint: Secondary | ICD-10-CM | POA: Diagnosis not present

## 2024-09-26 MED ORDER — MELOXICAM 7.5 MG PO TABS: 7.5000 mg | ORAL_TABLET | Freq: Every day | ORAL | 2 refills | Status: AC

## 2024-09-26 NOTE — Progress Notes (Signed)
 Subjective:  Patient ID: Allison Baldwin, female    DOB: 1976/10/29  Age: 48 y.o. MRN: [id number]  Chief Complaint  Patient presents with   Medical Management of Chronic Issues    HPI Mixed hyperlipidemia  Pt presents with hyperlipidemia. Compliance with treatment has been fair -  patient is compliant with medications, tries to maintain a low cholesterol diet , follows up as directed , but is not exercising The patient denies experiencing any hypercholesterolemia related symptoms.  Pt on lipitor 10mg  qd  Pt presents for follow up of hypertension. The patient is tolerating the medication well without side effects. Compliance with treatment has been good; including taking medication as directed , and following up as directed. Pt denies chest pain or dyspnea Currently on zestoretic  20/25mg  qd  Pt with anxiety with depression - stable on celexa  40mg  qd  Pt complains of joint pains in knees, elbows and ankles - says her joints pop a lot -- denies red, hot, swollen joints  Pt would like to schedule mammogram     09/26/2024    8:00 AM 03/15/2024    8:55 AM 09/09/2023   10:29 AM 03/31/2023    2:11 PM 02/23/2023    2:17 PM  Depression screen PHQ 2/9  Decreased Interest 0 0 0 0 3  Down, Depressed, Hopeless 0 1 0 0 3  PHQ - 2 Score 0 1 0 0 6  Altered sleeping 0 1  3 0  Tired, decreased energy 0 1  1 3   Change in appetite 0 0  0 0  Feeling bad or failure about yourself  0 0  0 1  Trouble concentrating 0 0  0 1  Moving slowly or fidgety/restless 0 0  0 0  Suicidal thoughts 0 0  0 0  PHQ-9 Score 0 3   4  11    Difficult doing work/chores Not difficult at all Not difficult at all  Not difficult at all Very difficult     Data saved with a previous flowsheet row definition        02/20/2020    6:40 AM 02/23/2023    2:19 PM 03/15/2024    8:55 AM 09/26/2024    8:00 AM  Fall Risk  Falls in the past year?  0 0 0  Was there an injury with Fall?  0 0 0  Fall Risk Category Calculator  0 0 0   (RETIRED) Patient Fall Risk Level Low fall risk      Patient at Risk for Falls Due to  History of fall(s) No Fall Risks No Fall Risks  Fall risk Follow up  Falls evaluation completed  Falls evaluation completed     Data saved with a previous flowsheet row definition     ROS CONSTITUTIONAL: Negative for chills, fatigue, fever, unintentional weight gain and unintentional weight loss.  E/N/T: Negative for ear pain, nasal congestion and sore throat.  CARDIOVASCULAR: Negative for chest pain, dizziness, palpitations and pedal edema.  RESPIRATORY: Negative for recent cough and dyspnea.  GASTROINTESTINAL: Negative for abdominal pain, acid reflux symptoms, constipation, diarrhea, nausea and vomiting.  MSK: see HPI INTEGUMENTARY: Negative for rash.  NEUROLOGICAL: Negative for dizziness and headaches.  PSYCHIATRIC: Negative for sleep disturbance and to question depression screen.  Negative for depression, negative for anhedonia.    Current Outpatient Medications:    atorvastatin  (LIPITOR) 10 MG tablet, Take 1 tablet (10 mg total) by mouth daily., Disp: 30 tablet, Rfl: 5   citalopram  (CELEXA ) 40 MG  tablet, Take 1 tablet (40 mg total) by mouth daily., Disp: 30 tablet, Rfl: 5   lisinopril -hydrochlorothiazide  (ZESTORETIC ) 20-25 MG tablet, Take 1 tablet by mouth daily., Disp: 30 tablet, Rfl: 5   meloxicam  (MOBIC ) 7.5 MG tablet, Take 1 tablet (7.5 mg total) by mouth daily., Disp: 30 tablet, Rfl: 2  Past Medical History:  Diagnosis Date   Depression    Hyperlipidemia    Hypertension    Vitamin D  deficiency    Objective:  PHYSICAL EXAM:   BP 120/84   Pulse 79   Temp 98.3 F (36.8 C) (Temporal)   Resp 18   Ht 5' 3.5 (1.613 m)   Wt 248 lb 12.8 oz (112.9 kg)   SpO2 97%   BMI 43.38 kg/m    GEN: Well nourished, well developed, in no acute distress  Cardiac: RRR; no murmurs, rubs, or gallops,no edema -  Respiratory:  normal respiratory rate and pattern with no distress - normal breath  sounds with no rales, rhonchi, wheezes or rubs MS: no deformity or atrophy - no redness or swelling of joints noted Skin: warm and dry, no rash  Neuro:  Alert and Oriented x 3, - CN II-Xii grossly intact Psych: euthymic mood, appropriate affect and demeanor  Assessment & Plan:    Mixed hyperlipidemia -     Comprehensive metabolic panel with GFR -     Lipid panel Continue med Watch diet Benign hypertension -     Comprehensive metabolic panel with GFR -     CBC with Differential/Platelet Continue med Anxiety with depression -     TSH  Pain in other joint -     Meloxicam ; Take 1 tablet (7.5 mg total) by mouth daily.  Dispense: 30 tablet; Refill: 2  Visit for screening mammogram -     3D Screening Mammogram, Left and Right; Future     Follow-up: Return in about 6 months (around 03/26/2025) for fasting physical with pap smear - 40 min.  An After Visit Summary was printed and given to the patient.  Allison Baldwin [facility] [phone]

## 2024-09-27 ENCOUNTER — Ambulatory Visit: Payer: Self-pay | Admitting: Physician Assistant

## 2024-09-27 LAB — COMPREHENSIVE METABOLIC PANEL WITH GFR
ALT: 28 IU/L (ref 0–32)
AST: 25 IU/L (ref 0–40)
Albumin: 4.4 g/dL (ref 3.9–4.9)
Alkaline Phosphatase: 100 IU/L (ref 41–116)
BUN/Creatinine Ratio: 11 (ref 9–23)
BUN: 9 mg/dL (ref 6–24)
Bilirubin Total: 0.3 mg/dL (ref 0.0–1.2)
CO2: 24 mmol/L (ref 20–29)
Calcium: 9.6 mg/dL (ref 8.7–10.2)
Chloride: 100 mmol/L (ref 96–106)
Creatinine, Ser: 0.83 mg/dL (ref 0.57–1.00)
Globulin, Total: 2.4 g/dL (ref 1.5–4.5)
Glucose: 93 mg/dL (ref 70–99)
Potassium: 4 mmol/L (ref 3.5–5.2)
Sodium: 139 mmol/L (ref 134–144)
Total Protein: 6.8 g/dL (ref 6.0–8.5)
eGFR: 88 mL/min/1.73 (ref >59)

## 2024-09-27 LAB — CBC WITH DIFFERENTIAL/PLATELET
Basophils Absolute: 0 x10E3/uL (ref 0.0–0.2)
Basos: 1 %
EOS (ABSOLUTE): 0.4 x10E3/uL (ref 0.0–0.4)
Eos: 7 %
Hematocrit: 41.8 % (ref 34.0–46.6)
Hemoglobin: 13.2 g/dL (ref 11.1–15.9)
Immature Grans (Abs): 0 x10E3/uL (ref 0.0–0.1)
Immature Granulocytes: 0 %
Lymphocytes Absolute: 1.4 x10E3/uL (ref 0.7–3.1)
Lymphs: 22 %
MCH: 27.7 pg (ref 26.6–33.0)
MCHC: 31.6 g/dL (ref 31.5–35.7)
MCV: 88 fL (ref 79–97)
Monocytes Absolute: 0.3 x10E3/uL (ref 0.1–0.9)
Monocytes: 6 %
Neutrophils Absolute: 4 x10E3/uL (ref 1.4–7.0)
Neutrophils: 64 %
Platelets: 267 x10E3/uL (ref 150–450)
RBC: 4.77 x10E6/uL (ref 3.77–5.28)
RDW: 13 % (ref 11.7–15.4)
WBC: 6.2 x10E3/uL (ref 3.4–10.8)

## 2024-09-27 LAB — TSH: TSH: 1.35 u[IU]/mL (ref 0.450–4.500)

## 2024-09-27 LAB — LIPID PANEL
Chol/HDL Ratio: 3.8 ratio (ref 0.0–4.4)
Cholesterol, Total: 174 mg/dL (ref 100–199)
HDL: 46 mg/dL (ref >39)
LDL Chol Calc (NIH): 108 mg/dL — ABNORMAL HIGH (ref 0–99)
Triglycerides: 110 mg/dL (ref 0–149)
VLDL Cholesterol Cal: 20 mg/dL (ref 5–40)

## 2024-10-30 ENCOUNTER — Inpatient Hospital Stay: Admission: RE | Admit: 2024-10-30 | Source: Ambulatory Visit

## 2024-12-05 ENCOUNTER — Other Ambulatory Visit: Payer: Self-pay | Admitting: Physician Assistant

## 2024-12-05 DIAGNOSIS — I10 Essential (primary) hypertension: Secondary | ICD-10-CM

## 2025-03-30 ENCOUNTER — Encounter: Admitting: Physician Assistant
